# Patient Record
Sex: Female | Born: 1985 | Race: White | Hispanic: No | Smoking: Current every day smoker
Health system: Southern US, Community
[De-identification: ages and names within clinical notes are randomized; demographics above are authoritative.]

## PROBLEM LIST (undated history)

## (undated) DIAGNOSIS — N200 Calculus of kidney: Secondary | ICD-10-CM

## (undated) DIAGNOSIS — F319 Bipolar disorder, unspecified: Secondary | ICD-10-CM

## (undated) HISTORY — PX: OTHER SURGICAL HISTORY: SHX169

---

## 2013-10-22 ENCOUNTER — Emergency Department (HOSPITAL_BASED_OUTPATIENT_CLINIC_OR_DEPARTMENT_OTHER)
Admission: EM | Admit: 2013-10-22 | Discharge: 2013-10-22 | Disposition: A | Discharge status: Home / Self Care | Payer: 59 | Attending: Emergency Medicine | Admitting: Emergency Medicine

## 2013-10-22 ENCOUNTER — Encounter (HOSPITAL_BASED_OUTPATIENT_CLINIC_OR_DEPARTMENT_OTHER): Payer: Self-pay | Admitting: Emergency Medicine

## 2013-10-22 DIAGNOSIS — IMO0002 Reserved for concepts with insufficient information to code with codable children: Secondary | ICD-10-CM | POA: Insufficient documentation

## 2013-10-22 DIAGNOSIS — Z3202 Encounter for pregnancy test, result negative: Secondary | ICD-10-CM | POA: Insufficient documentation

## 2013-10-22 DIAGNOSIS — F319 Bipolar disorder, unspecified: Secondary | ICD-10-CM | POA: Insufficient documentation

## 2013-10-22 DIAGNOSIS — T148XXA Other injury of unspecified body region, initial encounter: Secondary | ICD-10-CM

## 2013-10-22 DIAGNOSIS — M549 Dorsalgia, unspecified: Secondary | ICD-10-CM

## 2013-10-22 DIAGNOSIS — Z79899 Other long term (current) drug therapy: Secondary | ICD-10-CM | POA: Insufficient documentation

## 2013-10-22 DIAGNOSIS — X58XXXA Exposure to other specified factors, initial encounter: Secondary | ICD-10-CM | POA: Insufficient documentation

## 2013-10-22 DIAGNOSIS — F17200 Nicotine dependence, unspecified, uncomplicated: Secondary | ICD-10-CM | POA: Insufficient documentation

## 2013-10-22 DIAGNOSIS — Y939 Activity, unspecified: Secondary | ICD-10-CM | POA: Insufficient documentation

## 2013-10-22 DIAGNOSIS — R1110 Vomiting, unspecified: Secondary | ICD-10-CM | POA: Insufficient documentation

## 2013-10-22 DIAGNOSIS — Y929 Unspecified place or not applicable: Secondary | ICD-10-CM | POA: Insufficient documentation

## 2013-10-22 DIAGNOSIS — Z87442 Personal history of urinary calculi: Secondary | ICD-10-CM | POA: Insufficient documentation

## 2013-10-22 HISTORY — DX: Bipolar disorder, unspecified: F31.9

## 2013-10-22 HISTORY — DX: Calculus of kidney: N20.0

## 2013-10-22 LAB — URINALYSIS, ROUTINE W REFLEX MICROSCOPIC
Bilirubin Urine: NEGATIVE
GLUCOSE, UA: NEGATIVE mg/dL
Hgb urine dipstick: NEGATIVE
KETONES UR: NEGATIVE mg/dL
Leukocytes, UA: NEGATIVE
Nitrite: NEGATIVE
PH: 7 (ref 5.0–8.0)
Protein, ur: NEGATIVE mg/dL
Specific Gravity, Urine: 1.022 (ref 1.005–1.030)
Urobilinogen, UA: 1 mg/dL (ref 0.0–1.0)

## 2013-10-22 LAB — PREGNANCY, URINE: Preg Test, Ur: NEGATIVE

## 2013-10-22 MED ORDER — ONDANSETRON 4 MG PO TBDP
4.0000 mg | ORAL_TABLET | Freq: Once | ORAL | Status: AC
  Administered 2013-10-22: 4 mg via ORAL
  Filled 2013-10-22: qty 1

## 2013-10-22 MED ORDER — PROMETHAZINE HCL 25 MG PO TABS: 25.0000 mg | ORAL_TABLET | Freq: Four times a day (QID) | ORAL | Status: DC | PRN

## 2013-10-22 MED ORDER — HYDROCODONE-ACETAMINOPHEN 5-325 MG PO TABS: 1.0000 | ORAL_TABLET | ORAL | Status: DC | PRN

## 2013-10-22 NOTE — ED Provider Notes (Signed)
TIME SEEN: 12:18 PM  CHIEF COMPLAINT: Right-sided flank pain, vomiting  HPI: Patient is a 28 y.o. female with a history of bipolar disorder, possible history of prior kidney stones who presents emergency department with 2-3 days of right-sided flank pain that she describes as a pressure that is worse with movement and better with applying pressure to the area. She states that she was seen at [facility] emergency department several years ago and had blood in her urine and was told she [place] have a kidney stone. She did not have a CT scan at that time. She denies that she has had any dysuria, hematuria, urinary frequency or urgency. No vaginal bleeding or discharge. She did have one episode of nonbloody, nonbilious vomiting this morning. No diarrhea. No fevers or chills. No history of injury to the back. No numbness, tingling or focal weakness. No bowel or bladder incontinence.  ROS: See HPI Constitutional: no fever  Eyes: no drainage  ENT: no runny nose   Cardiovascular:  no chest pain  Resp: no SOB  GI: no vomiting GU: no dysuria Integumentary: no rash  Allergy: no hives  Musculoskeletal: no leg swelling  Neurological: no slurred speech ROS otherwise negative  PAST MEDICAL HISTORY/PAST SURGICAL HISTORY:  Past Medical History  Diagnosis Date  . Kidney stone   . Bipolar 1 disorder     MEDICATIONS:  Prior to Admission medications   Medication Sig Start Date End Date Taking? Authorizing Provider  ARIPiprazole (ABILIFY PO) Take by mouth.   Yes Historical Provider, MD  LamoTRIgine (LAMICTAL PO) Take by mouth.   Yes Historical Provider, MD  Lisdexamfetamine Dimesylate (VYVANSE PO) Take by mouth.   Yes Historical Provider, MD  LITHIUM PO Take by mouth.   Yes Historical Provider, MD    ALLERGIES:  Allergies  Allergen Reactions  . Fish Allergy     SOCIAL HISTORY:  History  Substance Use Topics  . Smoking status: Current Every Day Smoker -- 1.00 packs/day    Types: Cigarettes  .  Smokeless tobacco: Not on file  . Alcohol Use: No    FAMILY HISTORY: No family history on file.  EXAM: BP 133/75  Pulse 105  Temp(Src) 98.3 F (36.8 C) (Oral)  Resp 16  Ht 5\' 3"  (1.6 m)  Wt 165 lb (74.844 kg)  BMI 29.24 kg/m2  SpO2 100%  LMP 10/08/2013 CONSTITUTIONAL: Alert and oriented and responds appropriately to questions. Well-appearing; well-nourished HEAD: Normocephalic EYES: Conjunctivae clear, PERRL ENT: normal nose; no rhinorrhea; moist mucous membranes; pharynx without lesions noted NECK: Supple, no meningismus, no LAD  CARD: RRR; S1 and S2 appreciated; no murmurs, no clicks, no rubs, no gallops RESP: Normal chest excursion without splinting or tachypnea; breath sounds clear and equal bilaterally; no wheezes, no rhonchi, no rales,  ABD/GI: Normal bowel sounds; non-distended; soft, non-tender, no rebound, no guarding BACK:  The back appears normal and is mildly tender to palpation over the right flank with no crepitus or ecchymosis or skin changes, no CVA tenderness; no midline spinal tenderness, step-off or deformity EXT: Normal ROM in all joints; non-tender to palpation; no edema; normal capillary refill; no cyanosis    SKIN: Normal color for age and race; warm NEURO: Moves all extremities equally;  cranial nerves 2 to intact, sensation to light touch intact diffusely, normal gait PSYCH: The patient's mood and manner are appropriate. Grooming and personal hygiene are appropriate.  MEDICAL DECISION MAKING: Patient here with right-sided flank pain. Her pain is reproducible with palpation of her  back. Suspect this is musculoskeletal in nature. She is hemodynamically stable and well-appearing, nontoxic and in no apparent distress. Her urine shows no sign of infection or blood. I do not feel this is a kidney stone or pyelonephritis. We'll discharge the patient home with pain medication and nausea medicine. Given strict return precautions. Patient verbalizes understanding is  comfortable plan. She does not want any anti-inflammatories because she states it caused GI upset. She also reports that she does not want any narcotics and she has to go back to work today.      Layla Maw Latana Colin, DO 10/22/13 1221

## 2013-10-22 NOTE — ED Notes (Signed)
Right flank pain that started 2 days ago, vomited x 1 this am.

## 2019-11-20 ENCOUNTER — Other Ambulatory Visit: Payer: Self-pay

## 2019-11-20 ENCOUNTER — Emergency Department
Admission: EM | Admit: 2019-11-20 | Discharge: 2019-11-20 | Disposition: A | Discharge status: Home / Self Care | Payer: BC Managed Care – PPO | Attending: Student | Admitting: Student

## 2019-11-20 ENCOUNTER — Encounter: Payer: Self-pay | Admitting: Emergency Medicine

## 2019-11-20 DIAGNOSIS — G43719 Chronic migraine without aura, intractable, without status migrainosus: Secondary | ICD-10-CM | POA: Insufficient documentation

## 2019-11-20 DIAGNOSIS — Z79899 Other long term (current) drug therapy: Secondary | ICD-10-CM | POA: Diagnosis not present

## 2019-11-20 DIAGNOSIS — F17210 Nicotine dependence, cigarettes, uncomplicated: Secondary | ICD-10-CM | POA: Diagnosis not present

## 2019-11-20 DIAGNOSIS — R519 Headache, unspecified: Secondary | ICD-10-CM | POA: Diagnosis present

## 2019-11-20 LAB — BASIC METABOLIC PANEL
Anion gap: 10 (ref 5–15)
BUN: 11 mg/dL (ref 6–20)
CO2: 28 mmol/L (ref 22–32)
Calcium: 9.8 mg/dL (ref 8.9–10.3)
Chloride: 102 mmol/L (ref 98–111)
Creatinine, Ser: 0.92 mg/dL (ref 0.44–1.00)
GFR calc Af Amer: >60 mL/min (ref >60)
GFR calc non Af Amer: >60 mL/min (ref >60)
Glucose, Bld: 106 mg/dL — ABNORMAL HIGH (ref 70–99)
Potassium: 4.3 mmol/L (ref 3.5–5.1)
Sodium: 140 mmol/L (ref 135–145)

## 2019-11-20 LAB — CBC
HCT: 47.8 % — ABNORMAL HIGH (ref 36.0–46.0)
Hemoglobin: 15.6 g/dL — ABNORMAL HIGH (ref 12.0–15.0)
MCH: 31.7 pg (ref 26.0–34.0)
MCHC: 32.6 g/dL (ref 30.0–36.0)
MCV: 97.2 fL (ref 80.0–100.0)
Platelets: 303 10*3/uL (ref 150–400)
RBC: 4.92 MIL/uL (ref 3.87–5.11)
RDW: 12.9 % (ref 11.5–15.5)
WBC: 9.6 10*3/uL (ref 4.0–10.5)
nRBC: 0 % (ref 0.0–0.2)

## 2019-11-20 MED ORDER — PROMETHAZINE HCL 25 MG/ML IJ SOLN
25.0000 mg | Freq: Once | INTRAMUSCULAR | Status: AC
  Administered 2019-11-20: 19:00:00 25 mg via INTRAMUSCULAR
  Filled 2019-11-20: qty 1

## 2019-11-20 MED ORDER — KETOROLAC TROMETHAMINE 30 MG/ML IJ SOLN
30.0000 mg | Freq: Once | INTRAMUSCULAR | Status: AC
  Administered 2019-11-20: 19:00:00 30 mg via INTRAMUSCULAR
  Filled 2019-11-20: qty 1

## 2019-11-20 MED ORDER — DIPHENHYDRAMINE HCL 50 MG/ML IJ SOLN
50.0000 mg | Freq: Once | INTRAMUSCULAR | Status: AC
  Administered 2019-11-20: 19:00:00 50 mg via INTRAMUSCULAR
  Filled 2019-11-20: qty 1

## 2019-11-20 NOTE — ED Triage Notes (Signed)
Pt presents to ED c/o migraine x4 days. Has taken imitrex with no relief. Endorses light and sound sensitivity, nausea, and dizziness. Hx of same.

## 2019-11-20 NOTE — ED Provider Notes (Signed)
[facility] Emergency Department Provider Note  ____________________________________________  Time seen: Approximately 7:04 PM  I have reviewed the triage vital signs and the nursing notes.   HISTORY  Chief Complaint Migraine    HPI Heather Walter is a 34 y.o. adult who presents the emergency department complaining of migraine x4 days.  Patient states that she is experiencing a migraine x4 days.  Patient has a neurologist, they have been trying different medications to help manage the patient's migraines.  No recent trauma.  No recent illnesses.  Patient states that the migraine has been ongoing x4 days and not relieved by normal medications.  Patient states that typically he requires "shots" to finally make the migraine improved.  Patient has an appointment in 3 days to see her neurologist.  She will discuss medication changes at that time.  She is looking for relief at this time from her current migraine.  No atypical pattern.  Patient denies any other complaints at this time.         Past Medical History:  Diagnosis Date  . Bipolar 1 disorder (HCC)   . Kidney stone     There are no problems to display for this patient.   Past Surgical History:  Procedure Laterality Date  . knee arthoscopy      Prior to Admission medications   Medication Sig Start Date End Date Taking? Authorizing Provider  ARIPiprazole (ABILIFY PO) Take by mouth.    [provider]  HYDROcodone-acetaminophen (NORCO/VICODIN) 5-325 MG per tablet Take 1 tablet by mouth every 4 (four) hours as needed. 10/22/13   Ward, Layla Maw, DO  LamoTRIgine (LAMICTAL PO) Take by mouth.    [provider]  Lisdexamfetamine Dimesylate (VYVANSE PO) Take by mouth.    [provider]  LITHIUM PO Take by mouth.    [provider]  promethazine (PHENERGAN) 25 MG tablet Take 1 tablet (25 mg total) by mouth every 6 (six) hours as needed for nausea or vomiting. 10/22/13    Ward, Layla Maw, DO    Allergies Fish allergy and Risperidone and related  History reviewed. No pertinent family history.  Social History Social History   Tobacco Use  . Smoking status: Current Every Day Smoker    Packs/day: 0.50    Types: Cigarettes  . Smokeless tobacco: Never Used  Substance Use Topics  . Alcohol use: No  . Drug use: No     Review of Systems  Constitutional: No fever/chills Eyes: No visual changes. No discharge ENT: No upper respiratory complaints. Cardiovascular: no chest pain. Respiratory: no cough. No SOB. Gastrointestinal: No abdominal pain.  No nausea, no vomiting.  No diarrhea.  No constipation. Musculoskeletal: Negative for musculoskeletal pain. Skin: Negative for rash, abrasions, lacerations, ecchymosis. Neurological: Positive for typical migraine.  Denies focal weakness or numbness. 10-point ROS otherwise negative.  ____________________________________________   PHYSICAL EXAM:  VITAL SIGNS: ED Triage Vitals [11/20/19 1839]  Enc Vitals Group     BP (!) 153/88     Pulse Rate 94     Resp 18     Temp      Temp Source Oral     SpO2 99 %     Weight 147 lb (66.7 kg)     Height 5\' 4"  (1.626 m)     Head Circumference      Peak Flow      Pain Score 10     Pain Loc      Pain Edu?  Excl. in [place]?      Constitutional: Alert and oriented. Well appearing and in no acute distress. Eyes: Conjunctivae are normal. PERRL. EOMI. Head: Atraumatic. ENT:      Ears:       Nose: No congestion/rhinnorhea.      Mouth/Throat: Mucous membranes are moist.  Neck: No stridor.  Neck is supple full range of motion  Cardiovascular: Normal rate, regular rhythm. Normal S1 and S2.  Good peripheral circulation. Respiratory: Normal respiratory effort without tachypnea or retractions. Lungs CTAB. Good air entry to the bases with no decreased or absent breath sounds. Musculoskeletal: Full range of motion to all extremities. No gross deformities  appreciated. Neurologic:  Normal speech and language. No gross focal neurologic deficits are appreciated.  Cranial nerves II through XII grossly intact. Skin:  Skin is warm, dry and intact. No rash noted. Psychiatric: Mood and affect are normal. Speech and behavior are normal. Patient exhibits appropriate insight and judgement.   ____________________________________________   LABS (all labs ordered are listed, but only abnormal results are displayed)  Labs Reviewed  BASIC METABOLIC PANEL - Abnormal; Notable for the following components:      Result Value   Glucose, Bld 106 (*)    All other components within normal limits  CBC - Abnormal; Notable for the following components:   Hemoglobin 15.6 (*)    HCT 47.8 (*)    All other components within normal limits   ____________________________________________  EKG   ____________________________________________  RADIOLOGY   No results found.  ____________________________________________    PROCEDURES  Procedure(s) performed:    Procedures    Medications  ketorolac (TORADOL) 30 MG/ML injection 30 mg (30 mg Intramuscular Given 11/20/19 1920)  promethazine (PHENERGAN) injection 25 mg (25 mg Intramuscular Given 11/20/19 1920)  diphenhydrAMINE (BENADRYL) injection 50 mg (50 mg Intramuscular Given 11/20/19 1920)     ____________________________________________   INITIAL IMPRESSION / ASSESSMENT AND PLAN / ED COURSE  Pertinent labs & imaging results that were available during my care of the patient were reviewed by me and considered in my medical decision making (see chart for details).  Review of the [place] CSRS was performed in accordance of the [place] prior to dispensing any controlled drugs.           Patient's diagnosis is consistent with migraine headache.  Patient presented to emergency department with typical migraine.  Patient has been working with her neurologist for medication management to successfully treat  migraines.  This current migraine has been in place x4 days and is not responding to normal medications.  Patient is requesting the "shots" to help with the migraine.  Patient will be given migraine cocktail here in the emergency department.  She already has an appointment in 3 days to follow-up with her neurologist to discuss further migraine medications.  No medication changes will be attempted at this time..  Patient is given ED precautions to return to the ED for any worsening or new symptoms.     ____________________________________________  FINAL CLINICAL IMPRESSION(S) / ED DIAGNOSES  Final diagnoses:  Intractable chronic migraine without aura and without status migrainosus      NEW MEDICATIONS STARTED DURING THIS VISIT:  ED Discharge Orders    None          This chart was dictated using voice recognition software/Dragon. Despite best efforts to proofread, errors can occur which can change the meaning. Any change was purely unintentional.    Darletta Moll, PA-C 11/20/19 1951  Miguel Aschoff., MD 11/21/19 1225

## 2019-11-20 NOTE — ED Notes (Signed)
Pt states he has had a migraine x 4 days. Pt states he has hx of migraines and take nortriptyline daily and augments w/ imitrex for more severe migraines. Pt c/o blurred vision, nausea, shakiness, and dizziness intermittently w/ this current episode.

## 2019-12-16 ENCOUNTER — Other Ambulatory Visit: Payer: Self-pay

## 2019-12-16 ENCOUNTER — Encounter: Payer: Self-pay | Admitting: Medical Oncology

## 2019-12-16 ENCOUNTER — Emergency Department
Admission: EM | Admit: 2019-12-16 | Discharge: 2019-12-16 | Disposition: A | Discharge status: Home / Self Care | Payer: BC Managed Care – PPO | Attending: Emergency Medicine | Admitting: Emergency Medicine

## 2019-12-16 DIAGNOSIS — G43901 Migraine, unspecified, not intractable, with status migrainosus: Secondary | ICD-10-CM | POA: Diagnosis not present

## 2019-12-16 DIAGNOSIS — Z79899 Other long term (current) drug therapy: Secondary | ICD-10-CM | POA: Insufficient documentation

## 2019-12-16 DIAGNOSIS — R519 Headache, unspecified: Secondary | ICD-10-CM | POA: Diagnosis present

## 2019-12-16 DIAGNOSIS — F17210 Nicotine dependence, cigarettes, uncomplicated: Secondary | ICD-10-CM | POA: Insufficient documentation

## 2019-12-16 MED ORDER — KETOROLAC TROMETHAMINE 10 MG PO TABS: 10.0000 mg | ORAL_TABLET | Freq: Four times a day (QID) | ORAL | 0 refills | Status: DC | PRN

## 2019-12-16 MED ORDER — METOCLOPRAMIDE HCL 10 MG PO TABS: 10.0000 mg | ORAL_TABLET | Freq: Four times a day (QID) | ORAL | 0 refills | Status: DC | PRN

## 2019-12-16 MED ORDER — DIPHENHYDRAMINE HCL 25 MG PO CAPS: 50.0000 mg | ORAL_CAPSULE | Freq: Four times a day (QID) | ORAL | 0 refills | Status: AC | PRN

## 2019-12-16 MED ORDER — ONDANSETRON 4 MG PO TBDP: 4.0000 mg | ORAL_TABLET | Freq: Three times a day (TID) | ORAL | 0 refills | Status: DC | PRN

## 2019-12-16 MED ORDER — KETOROLAC TROMETHAMINE 30 MG/ML IJ SOLN
15.0000 mg | INTRAMUSCULAR | Status: AC
  Administered 2019-12-16: 18:00:00 15 mg via INTRAVENOUS
  Filled 2019-12-16: qty 1

## 2019-12-16 MED ORDER — METOCLOPRAMIDE HCL 5 MG/ML IJ SOLN
10.0000 mg | Freq: Once | INTRAMUSCULAR | Status: AC
  Administered 2019-12-16: 18:00:00 10 mg via INTRAVENOUS
  Filled 2019-12-16: qty 2

## 2019-12-16 MED ORDER — SODIUM CHLORIDE 0.9 % IV BOLUS
1000.0000 mL | Freq: Once | INTRAVENOUS | Status: AC
  Administered 2019-12-16: 18:00:00 1000 mL via INTRAVENOUS

## 2019-12-16 MED ORDER — DIPHENHYDRAMINE HCL 50 MG/ML IJ SOLN
25.0000 mg | Freq: Once | INTRAMUSCULAR | Status: AC
  Administered 2019-12-16: 18:00:00 25 mg via INTRAVENOUS
  Filled 2019-12-16: qty 1

## 2019-12-16 NOTE — ED Triage Notes (Signed)
Pt reports migraine headache x 4 days. Has had imitrex without relief. Hx of same.

## 2019-12-16 NOTE — ED Provider Notes (Signed)
[facility] Emergency Department Provider Note  ____________________________________________  Time seen: Approximately 9:18 PM  I have reviewed the triage vital signs and the nursing notes.   HISTORY  Chief Complaint Migraine    HPI Heather Walter is a 34 y.o. adult with a history of kidney stones bipolar disorder and recurrent migraine headaches who comes the ED complaining of a migraine headache for the past 4 days with photophobia, malaise.  Feels exactly like prior headaches, constant, waxing and waning, no aggravating or alleviating factors.  Tried his home medications including Imitrex without relief.      Past Medical History:  Diagnosis Date  . Bipolar 1 disorder (HCC)   . Kidney stone      There are no problems to display for this patient.    Past Surgical History:  Procedure Laterality Date  . knee arthoscopy       Prior to Admission medications   Medication Sig Start Date End Date Taking? Authorizing Provider  ARIPiprazole (ABILIFY PO) Take by mouth.    [provider]  diphenhydrAMINE (BENADRYL) 25 mg capsule Take 2 capsules (50 mg total) by mouth every 6 (six) hours as needed. 12/16/19   Sharman Cheek, MD  HYDROcodone-acetaminophen (NORCO/VICODIN) 5-325 MG per tablet Take 1 tablet by mouth every 4 (four) hours as needed. 10/22/13   Ward, Layla Maw, DO  ketorolac (TORADOL) 10 MG tablet Take 1 tablet (10 mg total) by mouth every 6 (six) hours as needed for moderate pain. 12/16/19   Sharman Cheek, MD  LamoTRIgine (LAMICTAL PO) Take by mouth.    [provider]  Lisdexamfetamine Dimesylate (VYVANSE PO) Take by mouth.    [provider]  LITHIUM PO Take by mouth.    [provider]  metoCLOPramide (REGLAN) 10 MG tablet Take 1 tablet (10 mg total) by mouth every 6 (six) hours as needed. 12/16/19   Sharman Cheek, MD  ondansetron (ZOFRAN ODT) 4 MG disintegrating tablet Take 1 tablet (4 mg total)  by mouth every 8 (eight) hours as needed for nausea or vomiting. 12/16/19   Sharman Cheek, MD  promethazine (PHENERGAN) 25 MG tablet Take 1 tablet (25 mg total) by mouth every 6 (six) hours as needed for nausea or vomiting. 10/22/13   Ward, Layla Maw, DO     Allergies Fish allergy and Risperidone and related   No family history on file.  Social History Social History   Tobacco Use  . Smoking status: Current Every Day Smoker    Packs/day: 0.50    Types: Cigarettes  . Smokeless tobacco: Never Used  Substance Use Topics  . Alcohol use: No  . Drug use: No    Review of Systems  Constitutional:   No fever or chills.  ENT:   No sore throat. No rhinorrhea. Cardiovascular:   No chest pain or syncope. Respiratory:   No dyspnea or cough. Gastrointestinal:   Negative for abdominal pain, vomiting and diarrhea.  Musculoskeletal:   Negative for focal pain or swelling All other systems reviewed and are negative except as documented above in ROS and HPI.  ____________________________________________   PHYSICAL EXAM:  VITAL SIGNS: ED Triage Vitals  Enc Vitals Group     BP 12/16/19 1619 (!) 158/89     Pulse Rate 12/16/19 1619 (!) 120     Resp 12/16/19 1619 18     Temp 12/16/19 1619 98.4 F (36.9 C)     Temp Source 12/16/19 1619 Oral     SpO2 12/16/19  1619 99 %     Weight 12/16/19 1615 145 lb 8.1 oz (66 kg)     Height 12/16/19 1615 5\' 4"  (1.626 m)     Head Circumference --      Peak Flow --      Pain Score 12/16/19 1615 6     Pain Loc --      Pain Edu? --      Excl. in [place]? --     Vital signs reviewed, nursing assessments reviewed.   Constitutional:   Alert and oriented. Non-toxic appearance. Eyes:   Conjunctivae are normal. EOMI. positive photophobia. ENT      Head:   Normocephalic and atraumatic.      Nose:   Wearing a mask.      Mouth/Throat:   Wearing a mask.      Neck:   No meningismus. Full ROM. Hematological/Lymphatic/Immunilogical:   No cervical  lymphadenopathy. Cardiovascular:   RRR. Symmetric bilateral radial and DP pulses.  No murmurs. Cap refill less than 2 seconds. Respiratory:   Normal respiratory effort without tachypnea/retractions. Breath sounds are clear and equal bilaterally. No wheezes/rales/rhonchi. Gastrointestinal:   Soft and nontender. Non distended. There is no CVA tenderness.  No rebound, rigidity, or guarding. Genitourinary:   deferred Musculoskeletal:   Normal range of motion in all extremities. No joint effusions.  No lower extremity tenderness.  No edema. Neurologic:   Normal speech and language.  Motor grossly intact. Cerebellar function intact.  Ambulatory with steady gait No acute focal neurologic deficits are appreciated.  Skin:    Skin is warm, dry and intact. No rash noted.  No petechiae, purpura, or bullae.  ____________________________________________    LABS (pertinent positives/negatives) (all labs ordered are listed, but only abnormal results are displayed) Labs Reviewed - No data to display ____________________________________________   EKG    ____________________________________________    RADIOLOGY  No results found.  ____________________________________________   PROCEDURES Procedures  ____________________________________________    CLINICAL IMPRESSION / ASSESSMENT AND PLAN / ED COURSE  Medications ordered in the ED: Medications  sodium chloride 0.9 % bolus 1,000 mL (0 mLs Intravenous Stopped 12/16/19 1954)  metoCLOPramide (REGLAN) injection 10 mg (10 mg Intravenous Given 12/16/19 1752)  ketorolac (TORADOL) 30 MG/ML injection 15 mg (15 mg Intravenous Given 12/16/19 1751)  diphenhydrAMINE (BENADRYL) injection 25 mg (25 mg Intravenous Given 12/16/19 1756)    Pertinent labs & imaging results that were available during my care of the patient were reviewed by me and considered in my medical decision making (see chart for details).  Heather Walter was evaluated in Emergency  Department on 12/16/2019 for the symptoms described in the history of present illness. He was evaluated in the context of the global COVID-19 pandemic, which necessitated consideration that the patient might be at risk for infection with the SARS-CoV-2 virus that causes COVID-19. Institutional protocols and algorithms that pertain to the evaluation of patients at risk for COVID-19 are in a state of rapid change based on information released by regulatory bodies including the CDC and federal and state organizations. These policies and algorithms were followed during the patient's care in the ED.   Patient presents with recurrent migraine headache, status migrainosus.  Also some evidence of dehydration as a result as evidenced by the triage tachycardia.  No evidence of meningitis encephalitis or intracranial hypertension.  No neuroimaging needed.  Patient given IV fluids for hydration, Reglan Benadryl and Toradol with relief of his headache.  He is eager to go home.  I will write him prescriptions for same medications to continue home therapy as needed.  Follow-up with primary care.      ____________________________________________   FINAL CLINICAL IMPRESSION(S) / ED DIAGNOSES    Final diagnoses:  Status migrainosus     ED Discharge Orders         Ordered    metoCLOPramide (REGLAN) 10 MG tablet  Every 6 hours PRN     12/16/19 2118    diphenhydrAMINE (BENADRYL) 25 mg capsule  Every 6 hours PRN     12/16/19 2118    ondansetron (ZOFRAN ODT) 4 MG disintegrating tablet  Every 8 hours PRN     12/16/19 2118    ketorolac (TORADOL) 10 MG tablet  Every 6 hours PRN     12/16/19 2118          Portions of this note were generated with dragon dictation software. Dictation errors [place] occur despite best attempts at proofreading.   Sharman Cheek, MD 12/16/19 2121

## 2020-02-08 ENCOUNTER — Emergency Department: Payer: BC Managed Care – PPO

## 2020-02-08 ENCOUNTER — Other Ambulatory Visit: Payer: Self-pay

## 2020-02-08 ENCOUNTER — Encounter: Payer: Self-pay | Admitting: *Deleted

## 2020-02-08 ENCOUNTER — Emergency Department
Admission: EM | Admit: 2020-02-08 | Discharge: 2020-02-08 | Disposition: A | Discharge status: Home / Self Care | Payer: BC Managed Care – PPO | Attending: Emergency Medicine | Admitting: Emergency Medicine

## 2020-02-08 DIAGNOSIS — Y990 Civilian activity done for income or pay: Secondary | ICD-10-CM | POA: Diagnosis not present

## 2020-02-08 DIAGNOSIS — S0003XA Contusion of scalp, initial encounter: Secondary | ICD-10-CM

## 2020-02-08 DIAGNOSIS — Z23 Encounter for immunization: Secondary | ICD-10-CM | POA: Insufficient documentation

## 2020-02-08 DIAGNOSIS — Y9289 Other specified places as the place of occurrence of the external cause: Secondary | ICD-10-CM | POA: Insufficient documentation

## 2020-02-08 DIAGNOSIS — W228XXA Striking against or struck by other objects, initial encounter: Secondary | ICD-10-CM | POA: Diagnosis not present

## 2020-02-08 DIAGNOSIS — Z79899 Other long term (current) drug therapy: Secondary | ICD-10-CM | POA: Diagnosis not present

## 2020-02-08 DIAGNOSIS — S0001XA Abrasion of scalp, initial encounter: Secondary | ICD-10-CM

## 2020-02-08 DIAGNOSIS — S0101XA Laceration without foreign body of scalp, initial encounter: Secondary | ICD-10-CM | POA: Diagnosis not present

## 2020-02-08 DIAGNOSIS — F17210 Nicotine dependence, cigarettes, uncomplicated: Secondary | ICD-10-CM | POA: Diagnosis not present

## 2020-02-08 DIAGNOSIS — S0990XA Unspecified injury of head, initial encounter: Secondary | ICD-10-CM | POA: Diagnosis present

## 2020-02-08 DIAGNOSIS — Y9389 Activity, other specified: Secondary | ICD-10-CM | POA: Diagnosis not present

## 2020-02-08 MED ORDER — TETANUS-DIPHTH-ACELL PERTUSSIS 5-2.5-18.5 LF-MCG/0.5 IM SUSP
0.5000 mL | Freq: Once | INTRAMUSCULAR | Status: AC
Start: 2020-02-08 — End: 2020-02-08
  Administered 2020-02-08: 17:00:00 0.5 mL via INTRAMUSCULAR
  Filled 2020-02-08: qty 0.5

## 2020-02-08 MED ORDER — ACETAMINOPHEN 500 MG PO TABS
1000.0000 mg | ORAL_TABLET | Freq: Once | ORAL | Status: AC
  Administered 2020-02-08: 16:00:00 1000 mg via ORAL
  Filled 2020-02-08: qty 2

## 2020-02-08 MED ORDER — CEPHALEXIN 500 MG PO CAPS: 500.0000 mg | ORAL_CAPSULE | Freq: Three times a day (TID) | ORAL | 0 refills | Status: AC

## 2020-02-08 NOTE — ED Provider Notes (Signed)
Emergency Department Provider Note  ____________________________________________  Time seen: Approximately 4:08 PM  I have reviewed the triage vital signs and the nursing notes.   HISTORY  Chief Complaint Laceration   Historian Patient     HPI Heather Walter is a 34 y.o. adult presents to the emergency department with a superficial laceration along the top of the head.  Patient reports that he struck his head on a piece of metal.  He is unsure whether or not he lost consciousness.  He is having some pain in his neck.  No numbness or tingling in the upper and lower extremities.  No chest pain, chest tightness or abdominal pain.  Tetanus status is not up-to-date.   Past Medical History:  Diagnosis Date  . Bipolar 1 disorder (HCC)   . Kidney stone      Immunizations up to date:  Yes.     Past Medical History:  Diagnosis Date  . Bipolar 1 disorder (HCC)   . Kidney stone     There are no problems to display for this patient.   Past Surgical History:  Procedure Laterality Date  . knee arthoscopy      Prior to Admission medications   Medication Sig Start Date End Date Taking? Authorizing Provider  ARIPiprazole (ABILIFY PO) Take by mouth.    [provider]  cephALEXin (KEFLEX) 500 MG capsule Take 1 capsule (500 mg total) by mouth 3 (three) times daily for 7 days. 02/08/20 02/15/20  Orvil Feil, PA-C  diphenhydrAMINE (BENADRYL) 25 mg capsule Take 2 capsules (50 mg total) by mouth every 6 (six) hours as needed. 12/16/19   Sharman Cheek, MD  LamoTRIgine (LAMICTAL PO) Take by mouth.    [provider]  Lisdexamfetamine Dimesylate (VYVANSE PO) Take by mouth.    [provider]  LITHIUM PO Take by mouth.    [provider]    Allergies Fish allergy and Risperidone and related  No family history on file.  Social History Social History   Tobacco Use  . Smoking status: Current Every Day Smoker    Packs/day: 0.50   Types: Cigarettes  . Smokeless tobacco: Never Used  Substance Use Topics  . Alcohol use: No  . Drug use: No     Review of Systems  Constitutional: No fever/chills Eyes:  No discharge ENT: No upper respiratory complaints. Respiratory: no cough. No SOB/ use of accessory muscles to breath Gastrointestinal:   No nausea, no vomiting.  No diarrhea.  No constipation. Musculoskeletal: Negative for musculoskeletal pain. Skin: Patient has scalp laceration.  ____________________________________________   PHYSICAL EXAM:  VITAL SIGNS: ED Triage Vitals  Enc Vitals Group     BP 02/08/20 1535 132/77     Pulse Rate 02/08/20 1535 94     Resp 02/08/20 1535 20     Temp 02/08/20 1535 98.3 F (36.8 C)     Temp Source 02/08/20 1535 Oral     SpO2 02/08/20 1535 100 %     Weight 02/08/20 1538 140 lb (63.5 kg)     Height 02/08/20 1538 5\' 4"  (1.626 m)     Head Circumference --      Peak Flow --      Pain Score 02/08/20 1537 10     Pain Loc --      Pain Edu? --      Excl. in [place]? --      Constitutional: Alert and oriented. Well appearing and in no acute distress. Eyes: Conjunctivae are  normal. PERRL. EOMI. Head: Atraumatic. Patient has a 1 cm superficial scalp laceration. No hematomas palpated.  ENT:      Ears: TMs are pearly.       Nose: No congestion/rhinnorhea.      Mouth/Throat: Mucous membranes are moist.  Neck: No stridor.  No cervical spine tenderness to palpation. Cardiovascular: Normal rate, regular rhythm. Normal S1 and S2.  Good peripheral circulation. Respiratory: Normal respiratory effort without tachypnea or retractions. Lungs CTAB. Good air entry to the bases with no decreased or absent breath sounds Gastrointestinal: Bowel sounds x 4 quadrants. Soft and nontender to palpation. No guarding or rigidity. No distention. Musculoskeletal: Full range of motion to all extremities. No obvious deformities noted Neurologic:  Normal for age. No gross focal neurologic deficits are  appreciated.  Skin: No rash noted. Psychiatric: Mood and affect are normal for age. Speech and behavior are normal.   ____________________________________________   LABS (all labs ordered are listed, but only abnormal results are displayed)  Labs Reviewed - No data to display ____________________________________________  EKG   ____________________________________________  RADIOLOGY   CT Head Wo Contrast  Result Date: 02/08/2020 CLINICAL DATA:  Pain. Abrasion on top left side of head. EXAM: CT HEAD WITHOUT CONTRAST CT CERVICAL SPINE WITHOUT CONTRAST TECHNIQUE: Multidetector CT imaging of the head and cervical spine was performed following the standard protocol without intravenous contrast. Multiplanar CT image reconstructions of the cervical spine were also generated. COMPARISON:  None. FINDINGS: CT HEAD FINDINGS Brain: No evidence of acute infarction, hemorrhage, hydrocephalus, extra-axial collection or mass lesion/mass effect. Vascular: No hyperdense vessel or unexpected calcification. Skull: Normal. Negative for fracture or focal lesion. Sinuses/Orbits: No acute finding. Other: None. CT CERVICAL SPINE FINDINGS Alignment: Normal. Skull base and vertebrae: No acute fracture. No primary bone lesion or focal pathologic process. Soft tissues and spinal canal: No prevertebral fluid or swelling. No visible canal hematoma. Disc levels: There are minimal degenerative changes of the cervical spine, greatest at the C5-C6 level. Upper chest: Negative. Other: None IMPRESSION: 1. Normal CT evaluation of the head. 2. No evidence of acute traumatic injury to the cervical spine. 3. Minimal degenerative changes of the cervical spine, greatest at the C5-C6 level. Electronically Signed   By: Katherine Mantle M.D.   On: 02/08/2020 16:32   CT Cervical Spine Wo Contrast  Result Date: 02/08/2020 CLINICAL DATA:  Pain. Abrasion on top left side of head. EXAM: CT HEAD WITHOUT CONTRAST CT CERVICAL SPINE WITHOUT  CONTRAST TECHNIQUE: Multidetector CT imaging of the head and cervical spine was performed following the standard protocol without intravenous contrast. Multiplanar CT image reconstructions of the cervical spine were also generated. COMPARISON:  None. FINDINGS: CT HEAD FINDINGS Brain: No evidence of acute infarction, hemorrhage, hydrocephalus, extra-axial collection or mass lesion/mass effect. Vascular: No hyperdense vessel or unexpected calcification. Skull: Normal. Negative for fracture or focal lesion. Sinuses/Orbits: No acute finding. Other: None. CT CERVICAL SPINE FINDINGS Alignment: Normal. Skull base and vertebrae: No acute fracture. No primary bone lesion or focal pathologic process. Soft tissues and spinal canal: No prevertebral fluid or swelling. No visible canal hematoma. Disc levels: There are minimal degenerative changes of the cervical spine, greatest at the C5-C6 level. Upper chest: Negative. Other: None IMPRESSION: 1. Normal CT evaluation of the head. 2. No evidence of acute traumatic injury to the cervical spine. 3. Minimal degenerative changes of the cervical spine, greatest at the C5-C6 level. Electronically Signed   By: Katherine Mantle M.D.   On: 02/08/2020 16:32  ____________________________________________    PROCEDURES  Procedure(s) performed:     Procedures     Medications  acetaminophen (TYLENOL) tablet 1,000 mg (1,000 mg Oral Given 02/08/20 1614)  Tdap (BOOSTRIX) injection 0.5 mL (0.5 mLs Intramuscular Given 02/08/20 1703)     ____________________________________________   INITIAL IMPRESSION / ASSESSMENT AND PLAN / ED COURSE  Pertinent labs & imaging results that were available during my care of the patient were reviewed by me and considered in my medical decision making (see chart for details).      Assessment and Plan:  Laceration:  Head contusion 34 year old female presents to the emergency department with a 1 cm scalp laceration sustained after patient  struck his head on a piece of metal.  CT head reveals no evidence of intracranial bleed or skull fracture.  CT of the cervical spine revealed no bony abnormality.  Patient declined laceration repair in the emergency department.  Basic wound care was given.  Tetanus status was updated and patient was started on Keflex.  Return precautions were given to return with new or worsening symptoms.   ____________________________________________  FINAL CLINICAL IMPRESSION(S) / ED DIAGNOSES  Final diagnoses:  Abrasion of scalp, initial encounter  Contusion of scalp, initial encounter      NEW MEDICATIONS STARTED DURING THIS VISIT:  ED Discharge Orders         Ordered    cephALEXin (KEFLEX) 500 MG capsule  3 times daily     02/08/20 1706              This chart was dictated using voice recognition software/Dragon. Despite best efforts to proofread, errors can occur which can change the meaning. Any change was purely unintentional.     Lannie Fields, PA-C 02/08/20 Adan Sis, MD 02/08/20 [phone]

## 2020-02-08 NOTE — ED Triage Notes (Signed)
Pt has a small lac to top of head.  Pt cut head on metal at work.  States no WC.  Bleeding controlled.  Pt alert.

## 2020-03-09 ENCOUNTER — Emergency Department: Payer: BC Managed Care – PPO

## 2020-03-09 ENCOUNTER — Encounter: Payer: Self-pay | Admitting: Emergency Medicine

## 2020-03-09 ENCOUNTER — Other Ambulatory Visit: Payer: Self-pay

## 2020-03-09 ENCOUNTER — Emergency Department
Admission: EM | Admit: 2020-03-09 | Discharge: 2020-03-09 | Disposition: A | Discharge status: Home / Self Care | Payer: BC Managed Care – PPO | Attending: Student in an Organized Health Care Education/Training Program | Admitting: Student in an Organized Health Care Education/Training Program

## 2020-03-09 DIAGNOSIS — S6991XA Unspecified injury of right wrist, hand and finger(s), initial encounter: Secondary | ICD-10-CM | POA: Diagnosis present

## 2020-03-09 DIAGNOSIS — Y9389 Activity, other specified: Secondary | ICD-10-CM | POA: Diagnosis not present

## 2020-03-09 DIAGNOSIS — Y999 Unspecified external cause status: Secondary | ICD-10-CM | POA: Diagnosis not present

## 2020-03-09 DIAGNOSIS — W2209XA Striking against other stationary object, initial encounter: Secondary | ICD-10-CM | POA: Insufficient documentation

## 2020-03-09 DIAGNOSIS — S60511A Abrasion of right hand, initial encounter: Secondary | ICD-10-CM

## 2020-03-09 DIAGNOSIS — F319 Bipolar disorder, unspecified: Secondary | ICD-10-CM | POA: Insufficient documentation

## 2020-03-09 DIAGNOSIS — Y929 Unspecified place or not applicable: Secondary | ICD-10-CM | POA: Insufficient documentation

## 2020-03-09 DIAGNOSIS — S60221A Contusion of right hand, initial encounter: Secondary | ICD-10-CM | POA: Diagnosis not present

## 2020-03-09 MED ORDER — NAPROXEN 500 MG PO TABS
500.0000 mg | ORAL_TABLET | Freq: Once | ORAL | Status: AC
  Administered 2020-03-09: 500 mg via ORAL
  Filled 2020-03-09: qty 1

## 2020-03-09 MED ORDER — NAPROXEN 500 MG PO TABS: 500.0000 mg | ORAL_TABLET | Freq: Two times a day (BID) | ORAL | Status: DC

## 2020-03-09 MED ORDER — NEOSPORIN PLUS PAIN RELIEF MS 3.5-10000-10 EX CREA: TOPICAL_CREAM | Freq: Two times a day (BID) | CUTANEOUS | 0 refills | Status: AC

## 2020-03-09 MED ORDER — BACITRACIN-NEOMYCIN-POLYMYXIN 400-5-5000 EX OINT
TOPICAL_OINTMENT | Freq: Once | CUTANEOUS | Status: AC
Start: 2020-03-09 — End: 2020-03-09
  Administered 2020-03-09: 1 via TOPICAL
  Filled 2020-03-09: qty 1

## 2020-03-09 NOTE — ED Notes (Signed)
See triage note  Presents with injury to right hand  States punched the refrigerator   Good pulses
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
## 2020-03-09 NOTE — Discharge Instructions (Addendum)
Follow discharge care instruction take medication as directed. °

## 2020-03-09 NOTE — ED Provider Notes (Signed)
[facility] Emergency Department Provider Note   ____________________________________________   First MD Initiated Contact with Patient 03/09/20 1526     (approximate)  I have reviewed the triage vital signs and the nursing notes.   HISTORY  Chief Complaint Hand Pain    HPI Heather Walter is a 34 y.o. adult patient complain of right hand pain secondary to punching refrigerator left last night.  Patient also sustained abrasions to the right hand.  Patient rates pain as a 7/10.  Patient described pain is "achy".  Denies loss of sensation.  No palliative measures for complaint.         Past Medical History:  Diagnosis Date  . Bipolar 1 disorder ([place])   . Kidney stone     There are no problems to display for this patient.   Past Surgical History:  Procedure Laterality Date  . knee arthoscopy      Prior to Admission medications   Medication Sig Start Date End Date Taking? Authorizing Provider  ARIPiprazole (ABILIFY PO) Take by mouth.    [provider]  diphenhydrAMINE (BENADRYL) 25 mg capsule Take 2 capsules (50 mg total) by mouth every 6 (six) hours as needed. 12/16/19   Carrie Mew, MD  LamoTRIgine (LAMICTAL PO) Take by mouth.    [provider]  Lisdexamfetamine Dimesylate (VYVANSE PO) Take by mouth.    [provider]  LITHIUM PO Take by mouth.    [provider]  naproxen (NAPROSYN) 500 MG tablet Take 1 tablet (500 mg total) by mouth 2 (two) times daily with a meal. 03/09/20   Sable Feil, PA-C  neomycin-polymyxin-pramoxine (NEOSPORIN PLUS) 1 % cream Apply topically 2 (two) times daily. 03/09/20   Sable Feil, PA-C    Allergies Fish allergy and Risperidone and related  No family history on file.  Social History Social History   Tobacco Use  . Smoking status: Current Every Day Smoker    Packs/day: 0.50    Types: Cigarettes  . Smokeless tobacco: Never Used  Substance Use Topics  .  Alcohol use: No  . Drug use: No    Review of Systems Constitutional: No fever/chills.  Transgender female Eyes: No visual changes. ENT: No sore throat. Cardiovascular: Denies chest pain. Respiratory: Denies shortness of breath. Gastrointestinal: No abdominal pain.  No nausea, no vomiting.  No diarrhea.  No constipation. Genitourinary: Negative for dysuria. Musculoskeletal: Right hand pain. Skin: Negative for rash.  Abrasions to the dorsal aspect of the right hand. Neurological: Negative for headaches, focal weakness or numbness. Psychiatric:  Bipolar. Allergic/Immunilogical: Patient allergies and Risperidone ____________________________________________   PHYSICAL EXAM:  VITAL SIGNS: ED Triage Vitals  Enc Vitals Group     BP 03/09/20 1413 123/74     Pulse Rate 03/09/20 1413 92     Resp 03/09/20 1413 20     Temp 03/09/20 1413 98.4 F (36.9 C)     Temp Source 03/09/20 1413 Oral     SpO2 03/09/20 1413 99 %     Weight 03/09/20 1411 140 lb (63.5 kg)     Height 03/09/20 1411 5\' 4"  (1.626 m)     Head Circumference --      Peak Flow --      Pain Score 03/09/20 1411 7     Pain Loc --      Pain Edu? --      Excl. in [place]? --     Constitutional: Alert and oriented. Well appearing and in  no acute distress. Cardiovascular: Normal rate, regular rhythm. Grossly normal heart sounds.  Good peripheral circulation. Respiratory: Normal respiratory effort.  No retractions. Lungs CTAB. Musculoskeletal: No obvious deformity to the right hand.  Presents moderate guarding to the ulnar aspect of the right hand.  Neurologic:  Normal speech and language. No gross focal neurologic deficits are appreciated. No gait instability. Skin:  Skin is warm, dry and intact. No rash noted.  Abrasion dorsal aspect of right hand Psychiatric: Mood and affect are normal. Speech and behavior are normal.  ____________________________________________   LABS (all labs ordered are listed, but only abnormal results are  displayed)  Labs Reviewed - No data to display ____________________________________________  EKG   ____________________________________________  RADIOLOGY  ED MD interpretation:    Official radiology report(s): DG Hand Complete Right  Result Date: 03/09/2020 CLINICAL DATA:  Pain after punching solid object EXAM: RIGHT HAND - COMPLETE 3+ VIEW COMPARISON:  None. FINDINGS: Frontal, oblique, and lateral views were obtained. There is soft tissue swelling dorsally. No fracture or dislocation. Joint spaces appear normal. No erosive change. IMPRESSION: Soft tissue swelling dorsally. No fracture or dislocation. No evident arthropathy. Electronically Signed   By: Bretta Bang III M.D.   On: 03/09/2020 16:23    ____________________________________________   PROCEDURES  Procedure(s) performed (including Critical Care):  Procedures   ____________________________________________   INITIAL IMPRESSION / ASSESSMENT AND PLAN / ED COURSE  As part of my medical decision making, I reviewed the following data within the electronic MEDICAL RECORD NUMBER     Patient presents with right hand pain secondary to striking refrigerator last night.  Discussed negative x-ray findings with patient.  Patient also sustained abrasions to the dorsal aspect of the right hand.  Patient given discharge care instructions.  Patient given a prescription for naproxen and Neosporin.  Abrasion with clean and bandaged prior to departure.  Patient hand was Ace wrapped.    Heather Walter was evaluated in Emergency Department on 03/09/2020 for the symptoms described in the history of present illness. He was evaluated in the context of the global COVID-19 pandemic, which necessitated consideration that the patient might be at risk for infection with the SARS-CoV-2 virus that causes COVID-19. Institutional protocols and algorithms that pertain to the evaluation of patients at risk for COVID-19 are in a state of rapid change  based on information released by regulatory bodies including the CDC and federal and state organizations. These policies and algorithms were followed during the patient's care in the ED.       ____________________________________________   FINAL CLINICAL IMPRESSION(S) / ED DIAGNOSES  Final diagnoses:  Contusion of right hand, initial encounter  Abrasion of right hand, initial encounter     ED Discharge Orders         Ordered    naproxen (NAPROSYN) 500 MG tablet  2 times daily with meals     03/09/20 1633    neomycin-polymyxin-pramoxine (NEOSPORIN PLUS) 1 % cream  2 times daily     03/09/20 1634           Note:  This document was prepared using Dragon voice recognition software and Kishimoto include unintentional dictation errors.    Joni Reining, PA-C 03/09/20 1638    Willy Eddy, MD 03/09/20 [phone]

## 2020-03-31 ENCOUNTER — Emergency Department: Payer: BC Managed Care – PPO

## 2020-03-31 ENCOUNTER — Encounter: Payer: Self-pay | Admitting: *Deleted

## 2020-03-31 ENCOUNTER — Other Ambulatory Visit: Payer: Self-pay

## 2020-03-31 DIAGNOSIS — Y939 Activity, unspecified: Secondary | ICD-10-CM | POA: Diagnosis not present

## 2020-03-31 DIAGNOSIS — S65901A Unspecified injury of unspecified blood vessel at wrist and hand level of right arm, initial encounter: Secondary | ICD-10-CM | POA: Diagnosis not present

## 2020-03-31 DIAGNOSIS — W228XXA Striking against or struck by other objects, initial encounter: Secondary | ICD-10-CM | POA: Diagnosis not present

## 2020-03-31 DIAGNOSIS — Y929 Unspecified place or not applicable: Secondary | ICD-10-CM | POA: Insufficient documentation

## 2020-03-31 DIAGNOSIS — Y999 Unspecified external cause status: Secondary | ICD-10-CM | POA: Diagnosis not present

## 2020-03-31 NOTE — ED Triage Notes (Signed)
Pt has right hand pain after hitting the refrigerator with his fist 5 days ago.  Pt did similar thing 2 weeks ago and was seen in the er   Pt has swelling to right hand.  Pt alert.

## 2020-04-01 ENCOUNTER — Emergency Department
Admission: EM | Admit: 2020-04-01 | Discharge: 2020-04-01 | Disposition: A | Discharge status: Home / Self Care | Payer: BC Managed Care – PPO | Attending: Emergency Medicine | Admitting: Emergency Medicine

## 2020-07-25 ENCOUNTER — Encounter: Payer: Self-pay | Admitting: Emergency Medicine

## 2020-07-25 ENCOUNTER — Emergency Department
Admission: EM | Admit: 2020-07-25 | Discharge: 2020-07-25 | Disposition: A | Discharge status: Home / Self Care | Payer: BC Managed Care – PPO | Attending: Emergency Medicine | Admitting: Emergency Medicine

## 2020-07-25 ENCOUNTER — Other Ambulatory Visit: Payer: Self-pay

## 2020-07-25 ENCOUNTER — Emergency Department: Payer: BC Managed Care – PPO

## 2020-07-25 DIAGNOSIS — S0990XA Unspecified injury of head, initial encounter: Secondary | ICD-10-CM | POA: Diagnosis present

## 2020-07-25 DIAGNOSIS — G44319 Acute post-traumatic headache, not intractable: Secondary | ICD-10-CM

## 2020-07-25 DIAGNOSIS — F17210 Nicotine dependence, cigarettes, uncomplicated: Secondary | ICD-10-CM | POA: Diagnosis not present

## 2020-07-25 DIAGNOSIS — S161XXA Strain of muscle, fascia and tendon at neck level, initial encounter: Secondary | ICD-10-CM | POA: Diagnosis not present

## 2020-07-25 DIAGNOSIS — Y92410 Unspecified street and highway as the place of occurrence of the external cause: Secondary | ICD-10-CM | POA: Diagnosis not present

## 2020-07-25 DIAGNOSIS — Y9389 Activity, other specified: Secondary | ICD-10-CM | POA: Insufficient documentation

## 2020-07-25 MED ORDER — METHOCARBAMOL 500 MG PO TABS: 500.0000 mg | ORAL_TABLET | Freq: Four times a day (QID) | ORAL | 0 refills | Status: DC

## 2020-07-25 MED ORDER — HYDROCODONE-ACETAMINOPHEN 5-325 MG PO TABS: 1.0000 | ORAL_TABLET | Freq: Four times a day (QID) | ORAL | 0 refills | Status: DC | PRN
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
## 2020-07-25 NOTE — Discharge Instructions (Signed)
Follow-up with your primary care provider if any continued problems.  Begin taking the medication as directed.  The pain medication and muscle relaxant together Strite cause drowsiness and therefore you should not drive or operate machinery while taking this.  It Kozlowski be necessary that you only take this at bedtime.  You Ebel also use ice or heat to your neck as needed for discomfort.  If any worsening of your symptoms return to the emergency department.
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
## 2020-08-09 ENCOUNTER — Emergency Department
Admission: EM | Admit: 2020-08-09 | Discharge: 2020-08-09 | Discharge status: Against Medical Advice | Payer: BC Managed Care – PPO

## 2020-10-19 ENCOUNTER — Encounter: Payer: Self-pay | Admitting: Emergency Medicine

## 2020-10-19 ENCOUNTER — Emergency Department
Admission: EM | Admit: 2020-10-19 | Discharge: 2020-10-19 | Disposition: A | Discharge status: Home / Self Care | Payer: BC Managed Care – PPO | Attending: Emergency Medicine | Admitting: Emergency Medicine

## 2020-10-19 ENCOUNTER — Other Ambulatory Visit: Payer: Self-pay

## 2020-10-19 DIAGNOSIS — F17210 Nicotine dependence, cigarettes, uncomplicated: Secondary | ICD-10-CM | POA: Diagnosis not present

## 2020-10-19 DIAGNOSIS — H9201 Otalgia, right ear: Secondary | ICD-10-CM | POA: Diagnosis present

## 2020-10-19 DIAGNOSIS — H6501 Acute serous otitis media, right ear: Secondary | ICD-10-CM | POA: Diagnosis not present

## 2020-10-19 MED ORDER — FLUTICASONE PROPIONATE 50 MCG/ACT NA SUSP: 2.0000 | Freq: Every day | NASAL | 0 refills | Status: AC

## 2020-10-19 MED ORDER — DEXAMETHASONE SODIUM PHOSPHATE 10 MG/ML IJ SOLN
10.0000 mg | Freq: Once | INTRAMUSCULAR | Status: AC
  Administered 2020-10-19: 10 mg via INTRAMUSCULAR
  Filled 2020-10-19: qty 1

## 2020-10-19 NOTE — ED Triage Notes (Signed)
Pt comes into the ED via POV c/o right ear pain.  Pt has pressure and sharp pain  In the ear x 1 week.

## 2020-10-19 NOTE — ED Provider Notes (Signed)
[facility] Emergency Department Provider Note ____________________________________________  Time seen: 18  I have reviewed the triage vital signs and the nursing notes.  HISTORY  Chief Complaint  Otalgia  HPI Heather Walter is a 35 y.o. adult who presents himself to the ED for evaluation of right otalgia. He describes a fullness, popping, and ear fullness. He also noted post-nasal drip and decreased hearing on the right. He uses cotton swabs daily. He denies any otorrhea, tinnitus, vertigo, foreign body sensation, or syncope. He is without fever, chills, or sweats.   Past Medical History:  Diagnosis Date  . Bipolar 1 disorder (HCC)   . Kidney stone     There are no problems to display for this patient.   Past Surgical History:  Procedure Laterality Date  . knee arthoscopy      Prior to Admission medications   Medication Sig Start Date End Date Taking? Authorizing Provider  ARIPiprazole (ABILIFY PO) Take by mouth.    [provider]  diphenhydrAMINE (BENADRYL) 25 mg capsule Take 2 capsules (50 mg total) by mouth every 6 (six) hours as needed. 12/16/19   Sharman Cheek, MD  HYDROcodone-acetaminophen (NORCO/VICODIN) 5-325 MG tablet Take 1 tablet by mouth every 6 (six) hours as needed for moderate pain. 07/25/20   Tommi Rumps, PA-C  LamoTRIgine (LAMICTAL PO) Take by mouth.    [provider]  Lisdexamfetamine Dimesylate (VYVANSE PO) Take by mouth.    [provider]  LITHIUM PO Take by mouth.    [provider]  methocarbamol (ROBAXIN) 500 MG tablet Take 1 tablet (500 mg total) by mouth 4 (four) times daily. 07/25/20   Tommi Rumps, PA-C  naproxen (NAPROSYN) 500 MG tablet Take 1 tablet (500 mg total) by mouth 2 (two) times daily with a meal. 03/09/20   Joni Reining, PA-C  neomycin-polymyxin-pramoxine (NEOSPORIN PLUS) 1 % cream Apply topically 2 (two) times daily. 03/09/20   Joni Reining, PA-C     Allergies Fish allergy and Risperidone and related  History reviewed. No pertinent family history.  Social History Social History   Tobacco Use  . Smoking status: Current Every Day Smoker    Packs/day: 0.50    Types: Cigarettes  . Smokeless tobacco: Never Used  Substance Use Topics  . Alcohol use: No  . Drug use: No    Review of Systems  Constitutional: Negative for fever. Eyes: Negative for visual changes. ENT: Negative for sore throat. right otalgia Cardiovascular: Negative for chest pain. Respiratory: Negative for shortness of breath. Gastrointestinal: Negative for abdominal pain, vomiting and diarrhea. Musculoskeletal: Negative for back pain. Skin: Negative for rash. Neurological: Negative for headaches, focal weakness or numbness. ____________________________________________  PHYSICAL EXAM:  VITAL SIGNS: ED Triage Vitals  Enc Vitals Group     BP 10/19/20 1811 (!) 154/90     Pulse Rate 10/19/20 1811 (!) 126     Resp 10/19/20 1811 18     Temp 10/19/20 1811 98.6 F (37 C)     Temp Source 10/19/20 1811 Oral     SpO2 10/19/20 1811 99 %     Weight 10/19/20 1811 155 lb (70.3 kg)     Height 10/19/20 1811 5\' 4"  (1.626 m)     Head Circumference --      Peak Flow --      Pain Score 10/19/20 1826 7     Pain Loc --      Pain Edu? --      Excl.  in GC? --     Constitutional: Alert and oriented. Well appearing and in no distress. Head: Normocephalic and atraumatic. Eyes: Conjunctivae are normal. PERRL. Normal extraocular movements Ears: Canals clear. TMs intact bilaterally. Right TM intact, clear, mildly erythematous, dull, and retracted. No purulent effusion is appreciated. Nose: No congestion/rhinorrhea/epistaxis. Mouth/Throat: Mucous membranes are moist. Uvula is midline and tonsils are flat. Oropharyngeal erythema is noted globally. Cobblestone appearance of the posterior pharyngeal wall is noted. Neck: Supple. No  thyromegaly. Hematological/Lymphatic/Immunological: No cervical lymphadenopathy. Cardiovascular: Normal rate, regular rhythm. Normal distal pulses. Respiratory: Normal respiratory effort. No wheezes/rales/rhonchi. Gastrointestinal: Soft and nontender. No distention. Musculoskeletal: Nontender with normal range of motion in all extremities.  Neurologic:  Normal gait without ataxia. Normal speech and language. No gross focal neurologic deficits are appreciated. Skin:  Skin is warm, dry and intact. No rash noted. ____________________________________________  PROCEDURES  Decadron 10 mg PO  Procedures ____________________________________________  INITIAL IMPRESSION / ASSESSMENT AND PLAN / ED COURSE  DDX; AOM, swimmers ear, TM rupture, URI, sinusitis  Patient presents to the ED for evaluation of right ear fullness and intermittent pain.  Exam is overall benign return at this time.  No signs of acute otitis media or TM rupture.  Symptoms likely represent a serous effusion with associated rhinitis and postnasal drip.  Patient will be treated empirically with a Decadron injection in the ED.  He is encouraged to take over-the-counter decongestants, antihistamines, and prescription nasal spray.  He will follow-up with primary provider or [place] urgent care for ongoing symptoms.  Return precautions have been discussed.   Heather Walter was evaluated in Emergency Department on 10/19/2020 for the symptoms described in the history of present illness. He was evaluated in the context of the global COVID-19 pandemic, which necessitated consideration that the patient might be at risk for infection with the SARS-CoV-2 virus that causes COVID-19. Institutional protocols and algorithms that pertain to the evaluation of patients at risk for COVID-19 are in a state of rapid change based on information released by regulatory bodies including the CDC and federal and state organizations. These policies and algorithms  were followed during the patient's care in the ED. ____________________________________________  FINAL CLINICAL IMPRESSION(S) / ED DIAGNOSES  Final diagnoses:  Non-recurrent acute serous otitis media of right ear      Lissa Hoard, PA-C 10/19/20 2005    Minna Antis, MD 10/19/20 2232

## 2020-10-19 NOTE — Discharge Instructions (Signed)
Your exam is consistent with fluid collection behind the ear.  This is likely an associated symptom with your runny nose and postnasal drip.  Take over-the-counter allergy medicine plus decongestant (Allegra-D, Claritin-D, Zyrtec-D).  You should also use a prescription nasal steroid as directed.  Follow-up with your primary provider or [place] urgent care for ongoing symptoms.

## 2020-10-19 NOTE — ED Notes (Signed)
Pt left without d/c instructions. Called patient at listed contact number with no answer and left voicemail for return call.

## 2021-04-30 ENCOUNTER — Emergency Department
Admission: EM | Admit: 2021-04-30 | Discharge: 2021-04-30 | Discharge status: Against Medical Advice | Payer: No Typology Code available for payment source

## 2021-04-30 NOTE — ED Triage Notes (Signed)
EMS attempting to give RN reports. Pt cursing and yelling stating that he is not staying. Pt left ED WR

## 2021-05-03 ENCOUNTER — Emergency Department: Payer: No Typology Code available for payment source

## 2021-05-03 ENCOUNTER — Emergency Department
Admission: EM | Admit: 2021-05-03 | Discharge: 2021-05-03 | Disposition: A | Discharge status: Home / Self Care | Payer: No Typology Code available for payment source | Attending: Emergency Medicine | Admitting: Emergency Medicine

## 2021-05-03 ENCOUNTER — Other Ambulatory Visit: Payer: Self-pay

## 2021-05-03 ENCOUNTER — Encounter: Payer: Self-pay | Admitting: Emergency Medicine

## 2021-05-03 DIAGNOSIS — W231XXA Caught, crushed, jammed, or pinched between stationary objects, initial encounter: Secondary | ICD-10-CM | POA: Diagnosis not present

## 2021-05-03 DIAGNOSIS — F17210 Nicotine dependence, cigarettes, uncomplicated: Secondary | ICD-10-CM | POA: Diagnosis not present

## 2021-05-03 DIAGNOSIS — S0990XA Unspecified injury of head, initial encounter: Secondary | ICD-10-CM | POA: Diagnosis present

## 2021-05-03 DIAGNOSIS — S060X0A Concussion without loss of consciousness, initial encounter: Secondary | ICD-10-CM | POA: Diagnosis not present

## 2021-05-03 DIAGNOSIS — S060X9A Concussion with loss of consciousness of unspecified duration, initial encounter: Secondary | ICD-10-CM
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
## 2021-05-03 NOTE — ED Notes (Signed)
Patient states he needed to get to work and did not have time to get vitals rechecked. Patient ambulatory with steady gait to lobby

## 2021-05-03 NOTE — Discharge Instructions (Addendum)
Return to the ER for new, worsening, or persistent severe memory loss, dizziness, headaches, vomiting, or any other new or worsening symptoms that concern you.
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
## 2021-05-03 NOTE — ED Notes (Signed)
Patient up to first nurse desk asking for discharge papers. Reports he walked out of room because they were to slow when he kept calling out. Patient given in papers in lobby

## 2021-05-03 NOTE — ED Notes (Signed)
In MRI

## 2021-06-02 IMAGING — CT CT HEAD W/O CM
3 series · 15 of 47 positions shown, 18 images · non-contrast
Comparison: None.

CLINICAL DATA: Pain. Abrasion on top left side of head.

EXAM:
CT HEAD WITHOUT CONTRAST
CT CERVICAL SPINE WITHOUT CONTRAST
TECHNIQUE: Multidetector CT imaging of the head and cervical spine was
performed following the standard protocol without intravenous
contrast. Multiplanar CT image reconstructions of the cervical spine
were also generated.

[Series 2: head wo · axial · 0.41mm/px · z∈[-134,-9]mm · 9 of 30 slices shown, 12 images]
[im 3/30  brain]
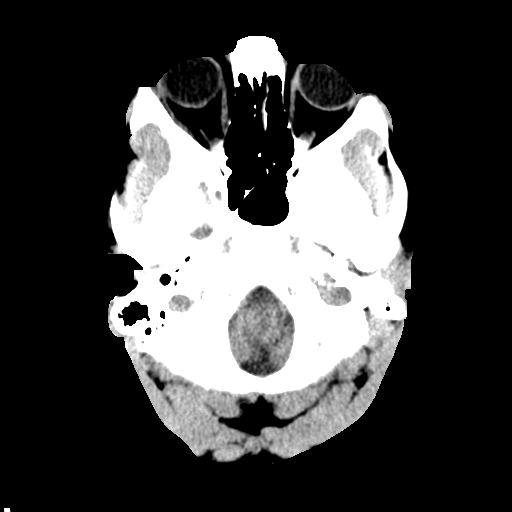
[im 3/30  bone]
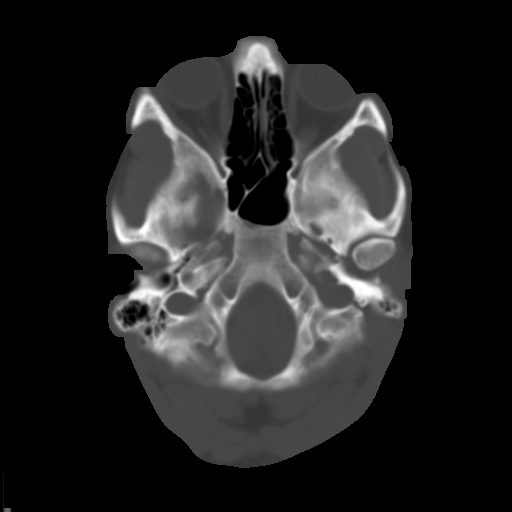
[im 6/30  brain]
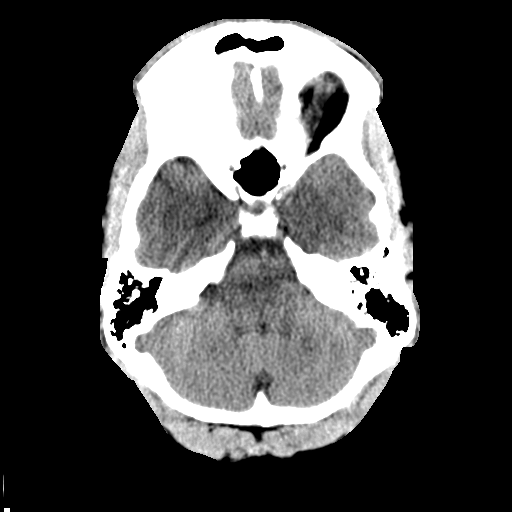
[im 9/30  brain]
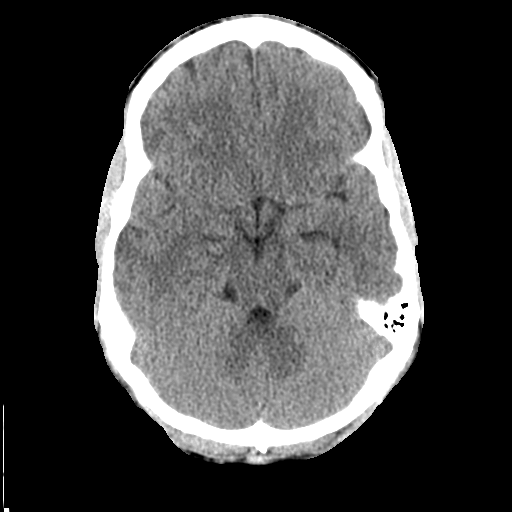
[im 12/30  brain]
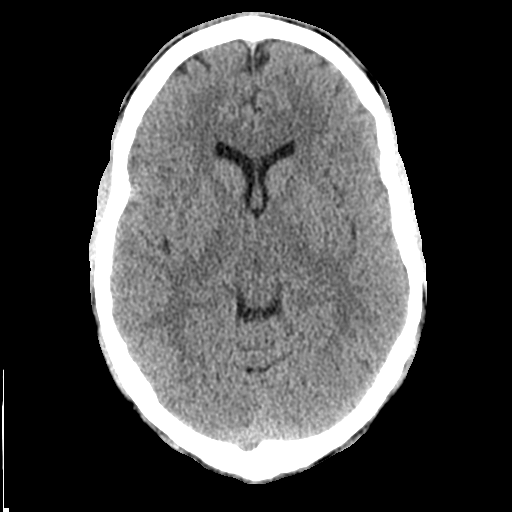
[im 16/30  brain]
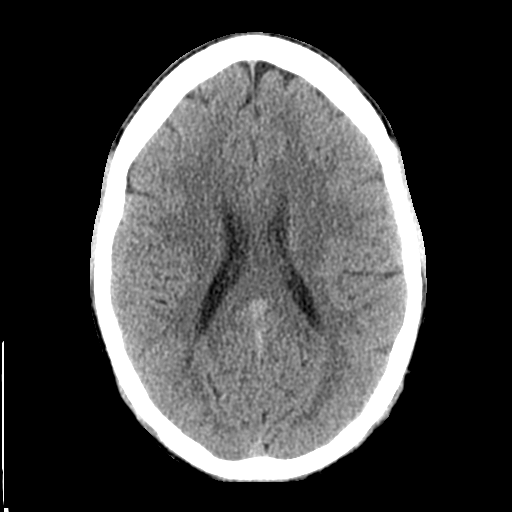
[im 16/30  bone]
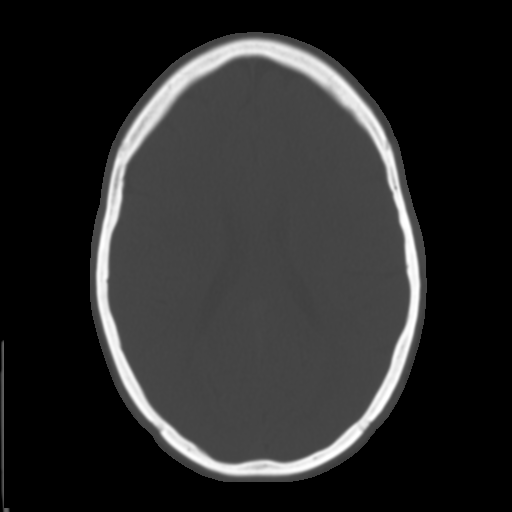
[im 19/30  brain]
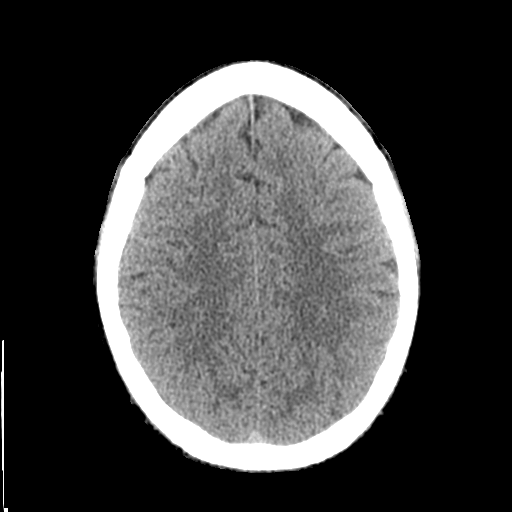
[im 22/30  brain]
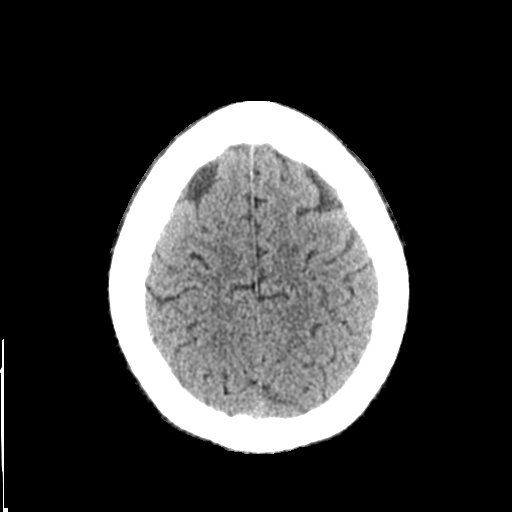
[im 25/30  brain]
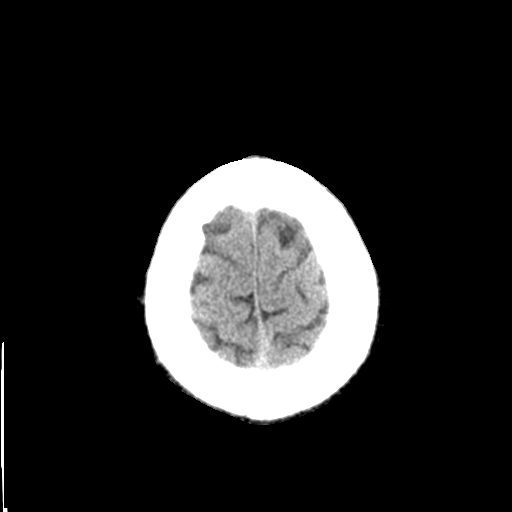
[im 28/30  brain]
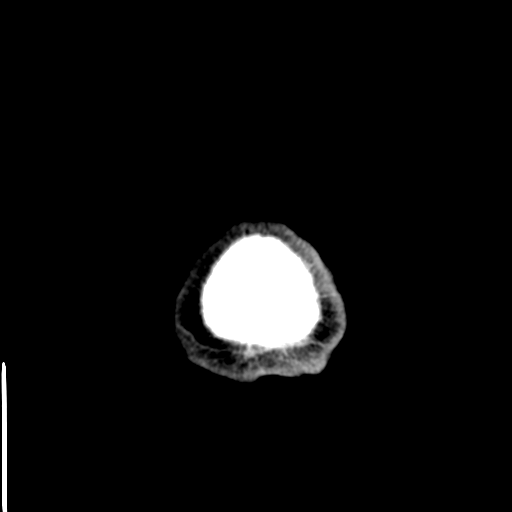
[im 28/30  bone]
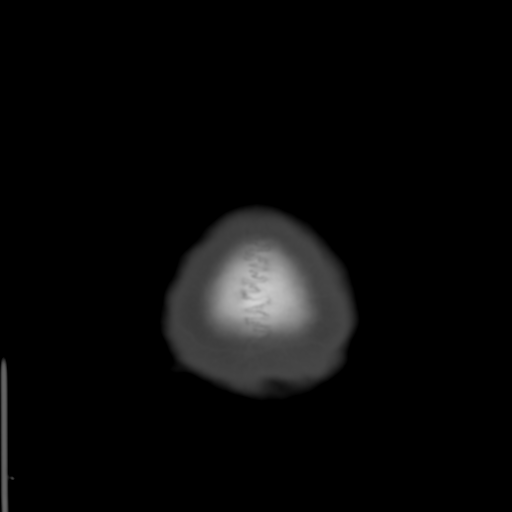

[Series 4: coronal soft tissue · coronal · 0.29mm/px · 3 of 65 slices shown]
[im 22/65  brain]
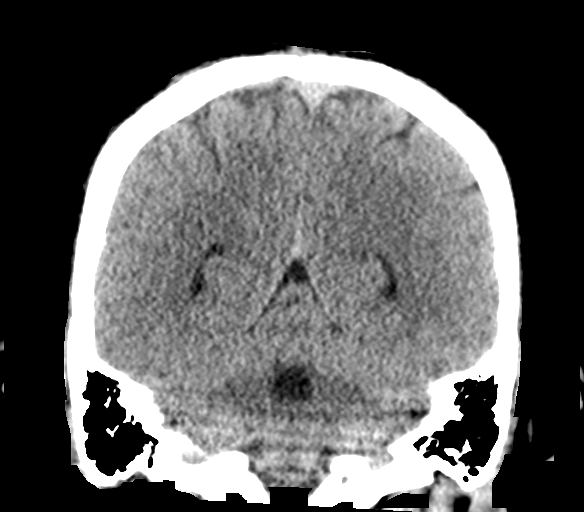
[im 29/65  brain]
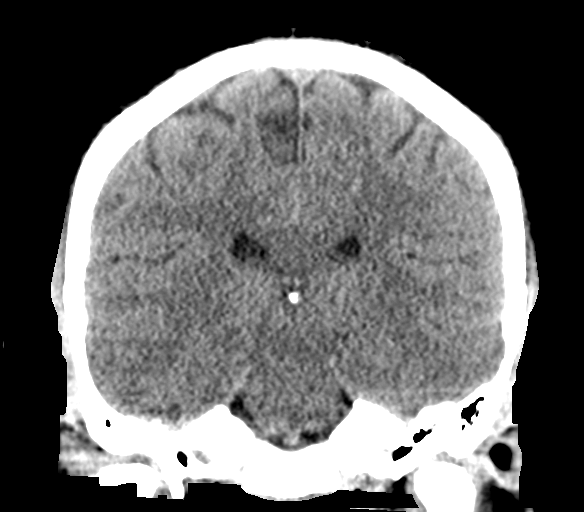
[im 36/65  brain]
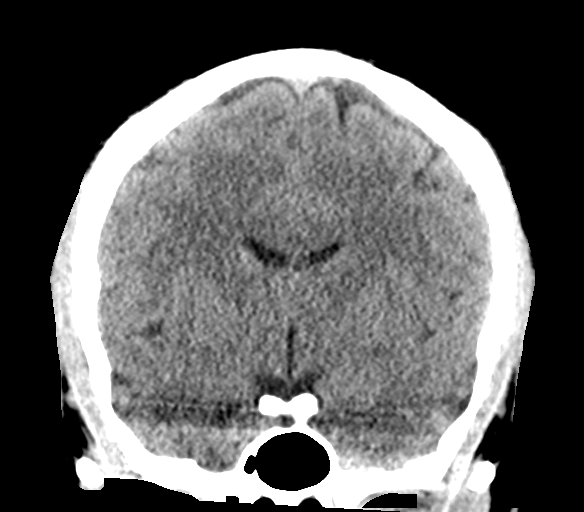

[Series 5: sagittal soft tissue · sagittal · 0.29mm/px · 3 of 53 slices shown]
[im 18/53  brain]
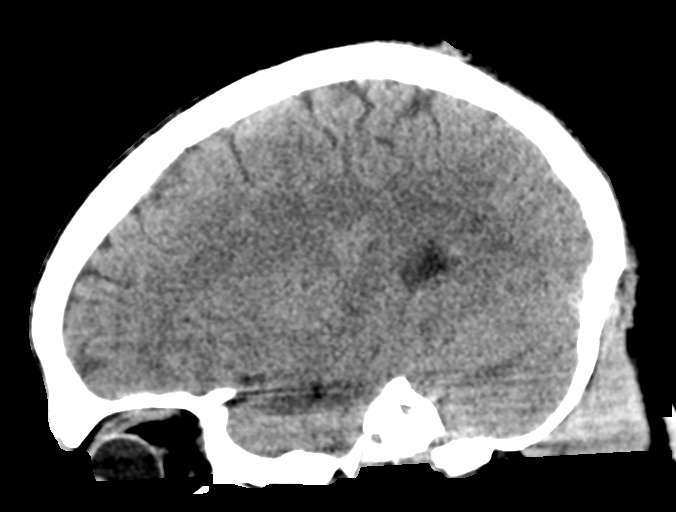
[im 27/53  brain]
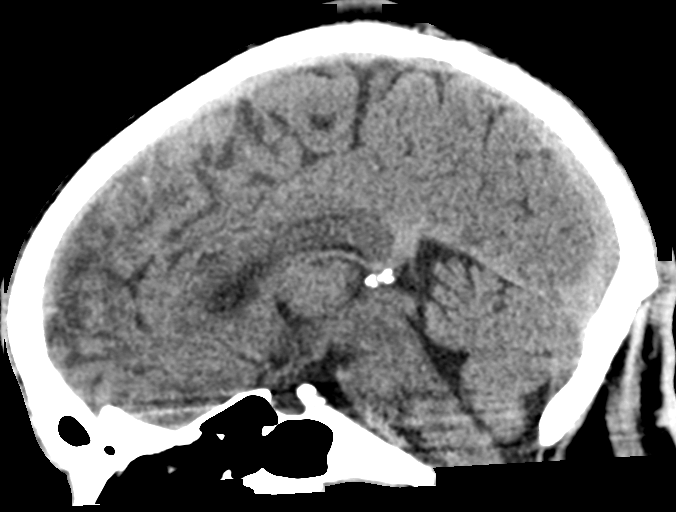
[im 35/53  brain]
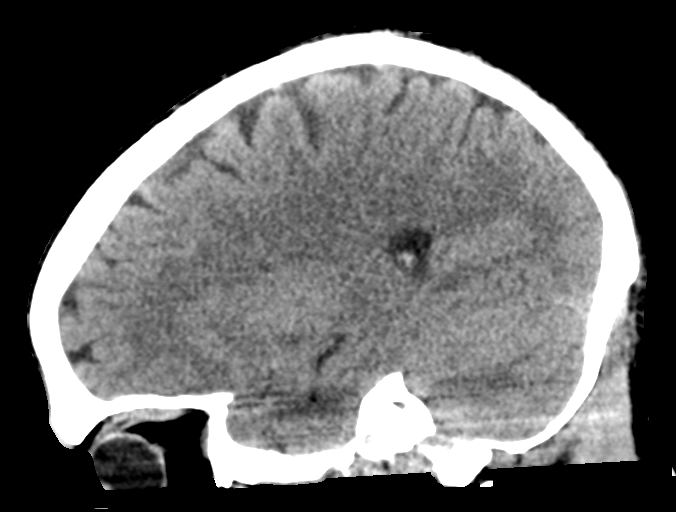

[15 of 47 positions shown; findings below may reference images not displayed]

FINDINGS: CT HEAD FINDINGS

Brain: No evidence of acute infarction, hemorrhage, hydrocephalus,
extra-axial collection or mass lesion/mass effect.

Vascular: No hyperdense vessel or unexpected calcification.

Skull: Normal. Negative for fracture or focal lesion.

Sinuses/Orbits: No acute finding.

Other: None.

CT CERVICAL SPINE FINDINGS

Alignment: Normal.

Skull base and vertebrae: No acute fracture. No primary bone lesion
or focal pathologic process.

Soft tissues and spinal canal: No prevertebral fluid or swelling. No
visible canal hematoma.

Disc levels: There are minimal degenerative changes of the cervical
spine, greatest at the C5-C6 level.

Upper chest: Negative.

Other: None
IMPRESSION: 1. Normal CT evaluation of the head.
2. No evidence of acute traumatic injury to the cervical spine.
3. Minimal degenerative changes of the cervical spine, greatest at
the C5-C6 level.

## 2021-06-02 IMAGING — CT CT CERVICAL SPINE W/O CM
3 of 4 series · 12 of 33 positions shown, 14 images · non-contrast
Comparison: None.

CLINICAL DATA: Pain. Abrasion on top left side of head.

EXAM:
CT HEAD WITHOUT CONTRAST
CT CERVICAL SPINE WITHOUT CONTRAST
TECHNIQUE: Multidetector CT imaging of the head and cervical spine was
performed following the standard protocol without intravenous
contrast. Multiplanar CT image reconstructions of the cervical spine
were also generated.

[Series 6: sagittal bone · sagittal · 0.29mm/px · 5 of 55 slices shown, 6 images]
[im 19/55  bone]
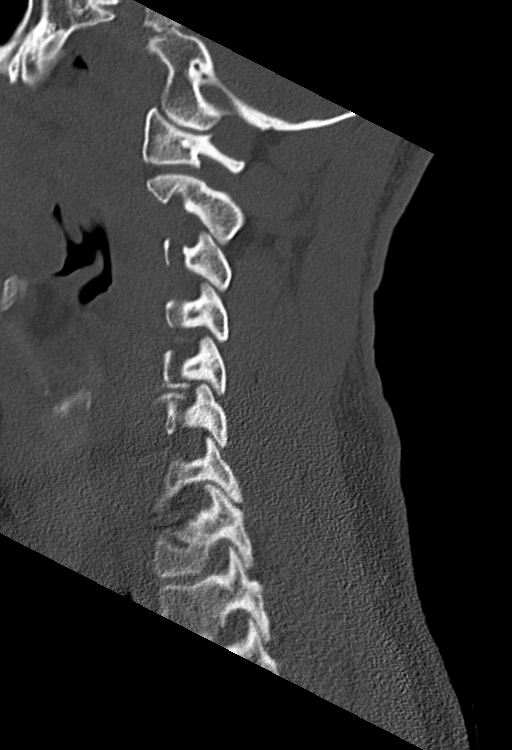
[im 23/55  bone]
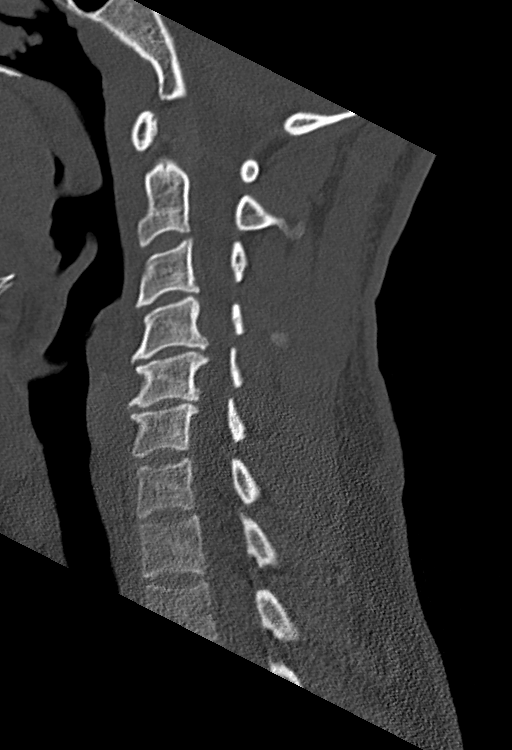
[im 28/55  soft-tissue]
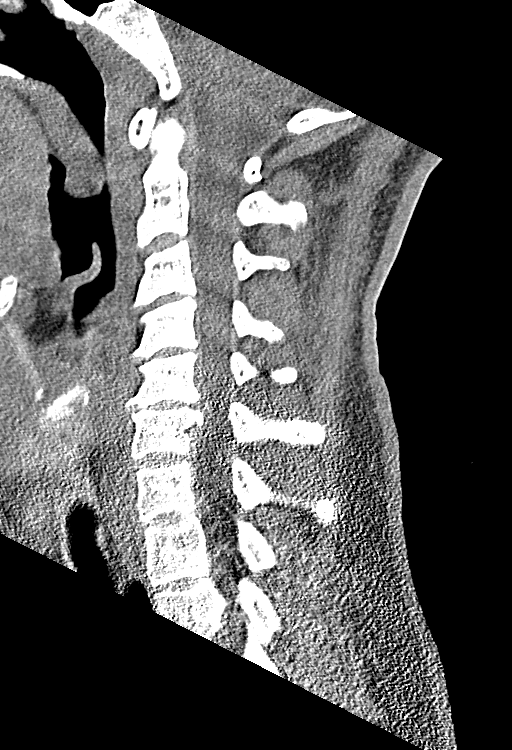
[im 28/55  bone]
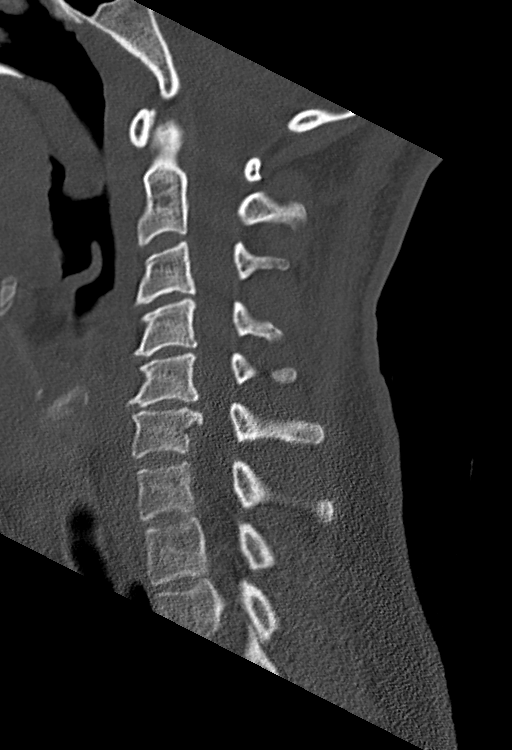
[im 32/55  bone]
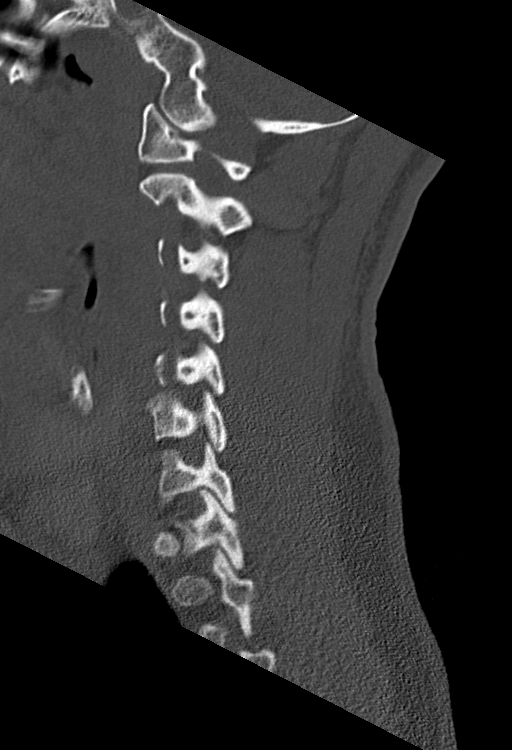
[im 37/55  bone]
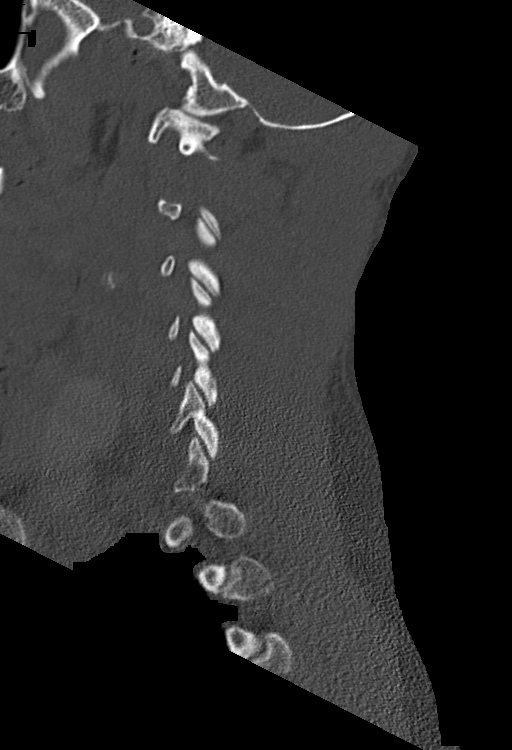

[Series 7: coronal bone · coronal · 0.23mm/px · 3 of 58 slices shown]
[im 13/58  bone]
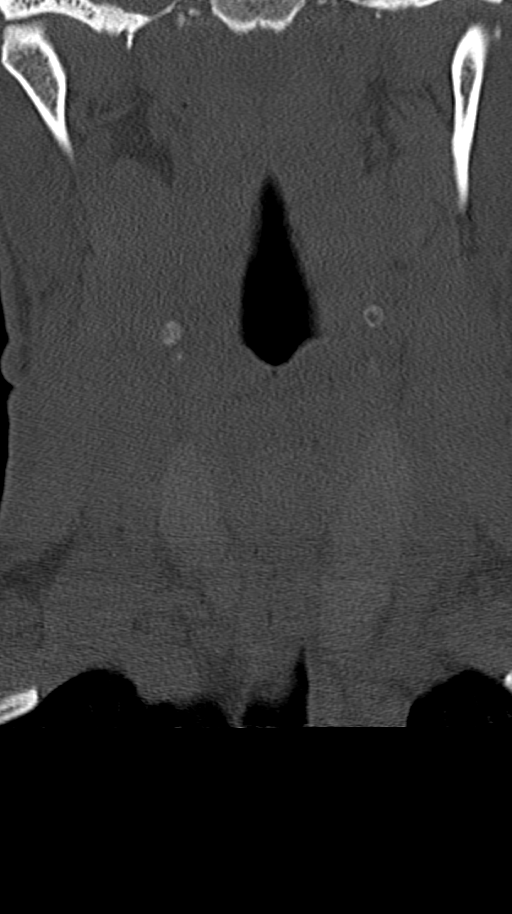
[im 24/58  bone]
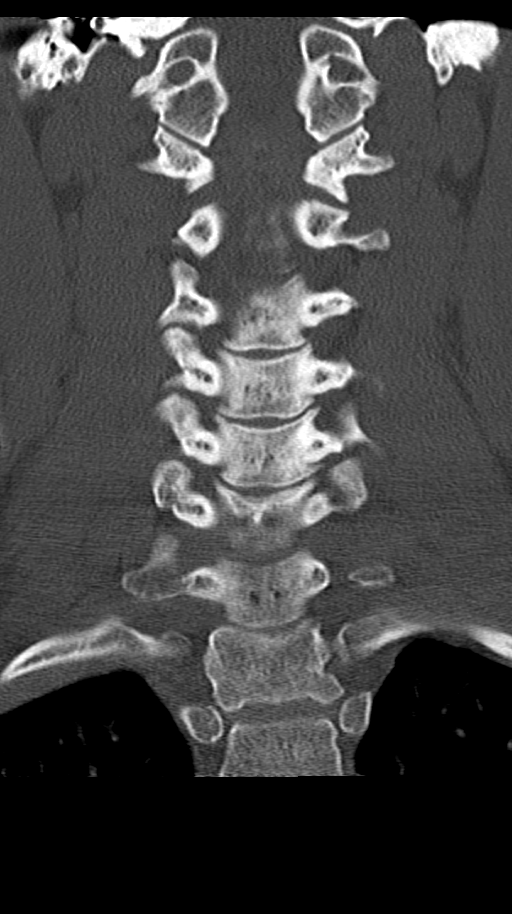
[im 35/58  bone]
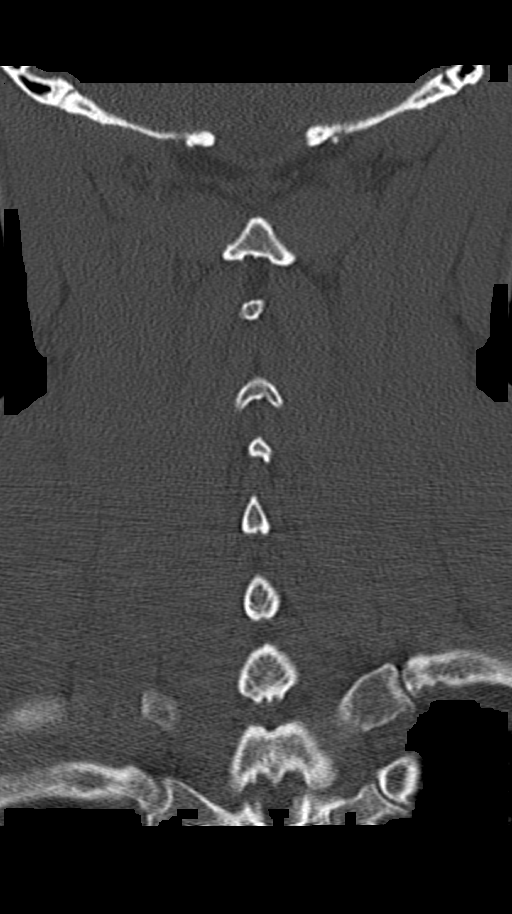

[Series 8: orthogonal bone · axial · 0.25mm/px · z∈[-309,-185]mm · 4 of 98 slices shown, 5 images]
[im 14/98  soft-tissue]
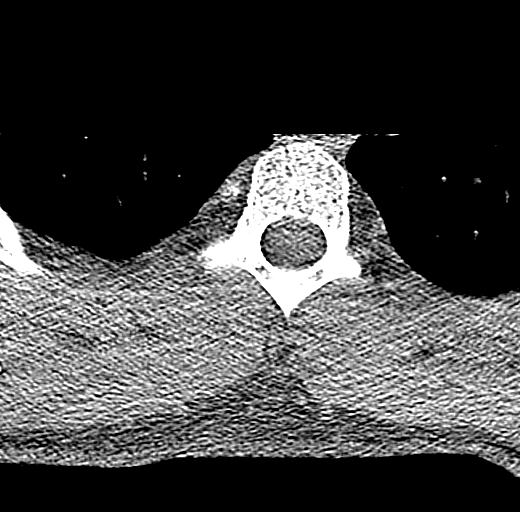
[im 14/98  bone]
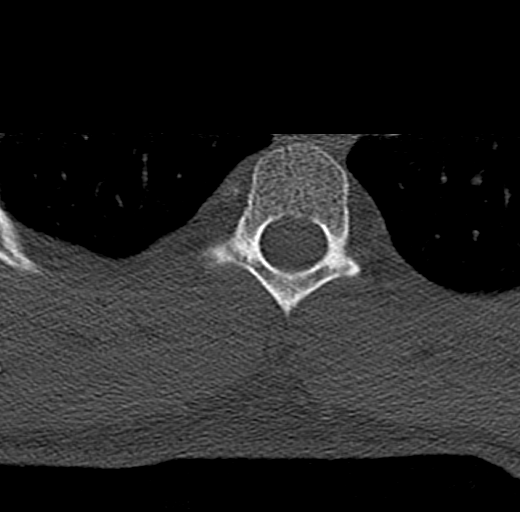
[im 42/98  bone]
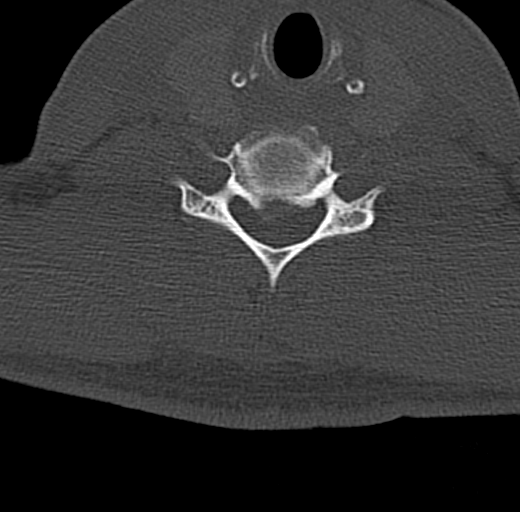
[im 56/98  bone]
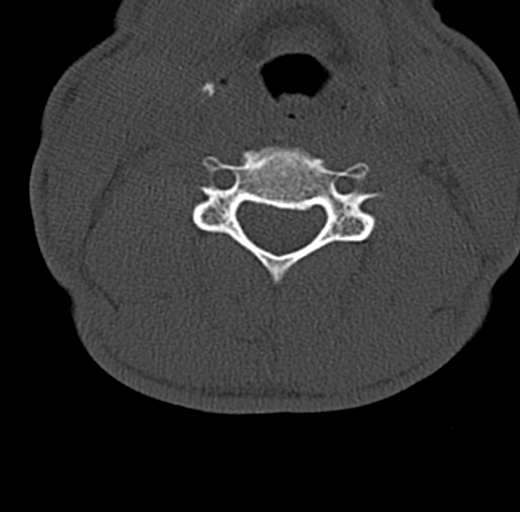
[im 84/98  bone]
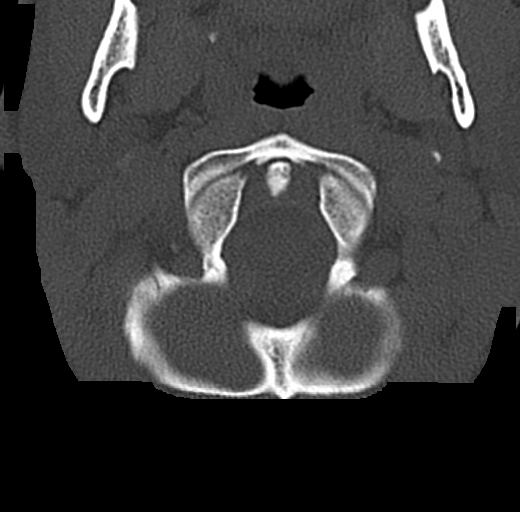

[12 of 33 positions shown; findings below may reference images not displayed]

FINDINGS: CT HEAD FINDINGS

Brain: No evidence of acute infarction, hemorrhage, hydrocephalus,
extra-axial collection or mass lesion/mass effect.

Vascular: No hyperdense vessel or unexpected calcification.

Skull: Normal. Negative for fracture or focal lesion.

Sinuses/Orbits: No acute finding.

Other: None.

CT CERVICAL SPINE FINDINGS

Alignment: Normal.

Skull base and vertebrae: No acute fracture. No primary bone lesion
or focal pathologic process.

Soft tissues and spinal canal: No prevertebral fluid or swelling. No
visible canal hematoma.

Disc levels: There are minimal degenerative changes of the cervical
spine, greatest at the C5-C6 level.

Upper chest: Negative.

Other: None
IMPRESSION: 1. Normal CT evaluation of the head.
2. No evidence of acute traumatic injury to the cervical spine.
3. Minimal degenerative changes of the cervical spine, greatest at
the C5-C6 level.

## 2021-11-09 ENCOUNTER — Emergency Department (HOSPITAL_COMMUNITY): Payer: Self-pay

## 2021-11-09 ENCOUNTER — Encounter (HOSPITAL_COMMUNITY): Payer: Self-pay | Admitting: Emergency Medicine

## 2021-11-09 ENCOUNTER — Emergency Department (HOSPITAL_COMMUNITY)
Admission: EM | Admit: 2021-11-09 | Discharge: 2021-11-09 | Disposition: A | Discharge status: Home / Self Care | Payer: Self-pay | Attending: Emergency Medicine | Admitting: Emergency Medicine

## 2021-11-09 DIAGNOSIS — M549 Dorsalgia, unspecified: Secondary | ICD-10-CM | POA: Insufficient documentation

## 2021-11-09 DIAGNOSIS — R079 Chest pain, unspecified: Secondary | ICD-10-CM | POA: Insufficient documentation

## 2021-11-09 DIAGNOSIS — E119 Type 2 diabetes mellitus without complications: Secondary | ICD-10-CM | POA: Insufficient documentation

## 2021-11-09 DIAGNOSIS — R110 Nausea: Secondary | ICD-10-CM | POA: Insufficient documentation

## 2021-11-09 DIAGNOSIS — I10 Essential (primary) hypertension: Secondary | ICD-10-CM | POA: Insufficient documentation

## 2021-11-09 DIAGNOSIS — R202 Paresthesia of skin: Secondary | ICD-10-CM | POA: Insufficient documentation

## 2021-11-09 DIAGNOSIS — N9489 Other specified conditions associated with female genital organs and menstrual cycle: Secondary | ICD-10-CM | POA: Insufficient documentation

## 2021-11-09 DIAGNOSIS — I2510 Atherosclerotic heart disease of native coronary artery without angina pectoris: Secondary | ICD-10-CM | POA: Insufficient documentation

## 2021-11-09 LAB — BASIC METABOLIC PANEL
Anion gap: 7 (ref 5–15)
BUN: 16 mg/dL (ref 6–20)
CO2: 27 mmol/L (ref 22–32)
Calcium: 9.8 mg/dL (ref 8.9–10.3)
Chloride: 105 mmol/L (ref 98–111)
Creatinine, Ser: 0.84 mg/dL (ref 0.44–1.00)
GFR, Estimated: >60 mL/min (ref >60)
Glucose, Bld: 107 mg/dL — ABNORMAL HIGH (ref 70–99)
Potassium: 5.7 mmol/L — ABNORMAL HIGH (ref 3.5–5.1)
Sodium: 139 mmol/L (ref 135–145)

## 2021-11-09 LAB — CBC
HCT: 46.9 % — ABNORMAL HIGH (ref 36.0–46.0)
Hemoglobin: 14.9 g/dL (ref 12.0–15.0)
MCH: 31.6 pg (ref 26.0–34.0)
MCHC: 31.8 g/dL (ref 30.0–36.0)
MCV: 99.6 fL (ref 80.0–100.0)
Platelets: 343 10*3/uL (ref 150–400)
RBC: 4.71 MIL/uL (ref 3.87–5.11)
RDW: 13.1 % (ref 11.5–15.5)
WBC: 9.3 10*3/uL (ref 4.0–10.5)
nRBC: 0 % (ref 0.0–0.2)

## 2021-11-09 LAB — I-STAT BETA HCG BLOOD, ED (MC, WL, AP ONLY): I-stat hCG, quantitative: <5 m[IU]/mL (ref <5)

## 2021-11-09 LAB — TROPONIN I (HIGH SENSITIVITY): Troponin I (High Sensitivity): 6 ng/L (ref <18)

## 2021-11-09 MED ORDER — IBUPROFEN 400 MG PO TABS
600.0000 mg | ORAL_TABLET | Freq: Once | ORAL | Status: DC
  Filled 2021-11-09: qty 1

## 2021-11-09 NOTE — ED Triage Notes (Signed)
Pt. Stated, Heather Walter been having chest pain that's stabbing that goes to my back and shoulder , the whole thing started about a month ago.
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
## 2021-11-09 NOTE — ED Notes (Signed)
Pt refuses to be on the cardiac monitor. I explained to pt since they are her for chest pain, that we needed to monitor the heart, but he still refused.

## 2021-11-09 NOTE — Discharge Instructions (Addendum)
Follow-up with your primary care in 2 weeks. Return for worsening chest pain, chest pain that is worse with inspiration. You will receive a call if the blood cultures grow back any bacteria, if that happens please return back to the ED as advised.

## 2021-11-09 NOTE — ED Notes (Signed)
Pt refused last set of VS and left before blood cultures were drawn.

## 2021-11-09 NOTE — ED Notes (Signed)
Pt to XR

## 2021-11-09 NOTE — ED Provider Notes (Signed)
[facility] EMERGENCY DEPARTMENT Provider Note   CSN: [id number] Arrival date & time: 11/09/21  [id number]     History  Chief Complaint  Patient presents with   Chest Pain   Nausea   Back Pain   Shoulder Pain    Heather Walter is a 36 y.o. adult.   Chest Pain Associated symptoms: back pain   Back Pain Associated symptoms: chest pain   Shoulder Pain Associated symptoms: back pain    Patient with medical history of female identity, female gender assigned at birth, bipolar 1 presents with chest pain.  Chest pain started roughly 1 month 1 to 2 months ago, its intermittent.  It started happening more frequently 2 weeks ago.  It is left-sided under his rib, chart.  The last few days he started noticing it to his chest as well as his back.  It is sharp, worse with movement and with sexual intercourse.  Nausea started last night, also having bilateral paresthesias to his hands.  He is not having these intermittently for months.  Patient does not have a personal history of hypertension, hyperlipidemia, diabetes, CAD, recent travel or surgeries.  Does smokes cigarettes daily, family history of CAD before the age of 39 per patient..  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  Denies any and injection drugs but endorses snorting meth until 1 week ago.  Patient states she stopped smoking meth due to the chest pain she has been experiencing.  Home Medications Prior to Admission medications   Medication Sig Start Date End Date Taking? Authorizing Provider  ARIPiprazole (ABILIFY PO) Take by mouth.    [provider]  diphenhydrAMINE (BENADRYL) 25 mg capsule Take 2 capsules (50 mg total) by mouth every 6 (six) hours as needed. 12/16/19   Carrie Mew, MD  fluticasone [facility]) 50 MCG/ACT nasal spray Place 2 sprays into both nostrils daily. 10/19/20   Menshew, Dannielle Karvonen, PA-C  HYDROcodone-acetaminophen (NORCO/VICODIN) 5-325 MG tablet Take 1 tablet  by mouth every 6 (six) hours as needed for moderate pain. 07/25/20   Johnn Hai, PA-C  LamoTRIgine (LAMICTAL PO) Take by mouth.    [provider]  Lisdexamfetamine Dimesylate (VYVANSE PO) Take by mouth.    [provider]  LITHIUM PO Take by mouth.    [provider]  methocarbamol (ROBAXIN) 500 MG tablet Take 1 tablet (500 mg total) by mouth 4 (four) times daily. 07/25/20   Johnn Hai, PA-C  naproxen (NAPROSYN) 500 MG tablet Take 1 tablet (500 mg total) by mouth 2 (two) times daily with a meal. 03/09/20   Sable Feil, PA-C  neomycin-polymyxin-pramoxine (NEOSPORIN PLUS) 1 % cream Apply topically 2 (two) times daily. 03/09/20   Sable Feil, PA-C      Allergies    Fish allergy and Risperidone and related    Review of Systems   Review of Systems  Cardiovascular:  Positive for chest pain.  Musculoskeletal:  Positive for back pain.   Physical Exam Updated Vital Signs BP (!) 142/74 (BP Location: Right Arm)    Pulse 85    Temp 98.5 F (36.9 C) (Oral)    Resp (!) 22    SpO2 100%  Physical Exam Vitals and nursing note reviewed. Exam conducted with a chaperone present.  Constitutional:      Appearance: Normal appearance.  HENT:     Head: Normocephalic and atraumatic.  Eyes:     General: No scleral icterus.  Right eye: No discharge.        Left eye: No discharge.     Extraocular Movements: Extraocular movements intact.     Pupils: Pupils are equal, round, and reactive to light.  Cardiovascular:     Rate and Rhythm: Normal rate and regular rhythm.     Pulses: Normal pulses.     Heart sounds: Normal heart sounds. No murmur heard.   No friction rub. No gallop.  Pulmonary:     Effort: Pulmonary effort is normal. No respiratory distress.     Breath sounds: Normal breath sounds.  Chest:     Chest wall: Tenderness present.  Abdominal:     General: Abdomen is flat. Bowel sounds are normal. There is no distension.     Palpations: Abdomen is  soft.     Tenderness: There is no abdominal tenderness.  Musculoskeletal:     Right lower leg: No edema.     Left lower leg: No edema.  Skin:    General: Skin is warm and dry.     Coloration: Skin is not jaundiced.  Neurological:     Mental Status: He is alert. Mental status is at baseline.     Coordination: Coordination normal.    ED Results / Procedures / Treatments   Labs (all labs ordered are listed, but only abnormal results are displayed) Labs Reviewed  BASIC METABOLIC PANEL - Abnormal; Notable for the following components:      Result Value   Potassium 5.7 (*)    Glucose, Bld 107 (*)    All other components within normal limits  CBC - Abnormal; Notable for the following components:   HCT 46.9 (*)    All other components within normal limits  CULTURE, BLOOD (ROUTINE X 2)  CULTURE, BLOOD (ROUTINE X 2)  I-STAT BETA HCG BLOOD, ED ([facility], [facility], [facility] ONLY)  TROPONIN I (HIGH SENSITIVITY)    EKG EKG Interpretation  Date/Time:  Thursday November 09 2021 08:45:27 EST Ventricular Rate:  79 PR Interval:    QRS Duration: 72 QT Interval:  318 QTC Calculation: 364 R Axis:   74 Text Interpretation: Normal sinus rhythm No previous ECGs available No old tracing to compare Confirmed by Deno Etienne [phone]) on 11/09/2021 9:45:24 AM  Radiology DG Chest 2 View  Result Date: 11/09/2021 CLINICAL DATA:  Chest pain EXAM: CHEST - 2 VIEW COMPARISON:  None. FINDINGS: The heart size and mediastinal contours are within normal limits. Both lungs are clear. The visualized skeletal structures are unremarkable. IMPRESSION: No active cardiopulmonary disease. Electronically Signed   By: Kathreen Devoid M.D.   On: 11/09/2021 10:24    Procedures Procedures    Medications Ordered in ED Medications  ibuprofen (ADVIL) tablet 600 mg (600 mg Oral Patient Refused/Not Given 11/09/21 1023)    ED Course/ Medical Decision Making/ A&P                           Medical Decision Making Amount and/or Complexity of  Data Reviewed Labs: ordered. Radiology: ordered.   This patient presents to the ED for concern of chest pain, this involves an extensive number of treatment options, and is a complaint that carries with it a high risk of complications and morbidity.  The differential diagnosis includes costochondritis, pericarditis, ACS, pneumonia, dissection, GERD, other   Co morbidities that complicate the patient evaluation: N/A   Additional history obtained: -Additional history obtained from friend -External records from outside source obtained and  reviewed including: Chart review including previous notes, labs, imaging, consultation notes   Lab Tests: -I ordered, reviewed, and interpreted labs.  The pertinent results include:  No leukocytosis, no anemia.  Troponin low at 6, no gross electrolyte derangement.  There is hemolysis in the tube, potassium is elevated at 5.7 but low suspicion for an acute renal failure given setting of normal creatinine.  Advised patient to recheck with PCP next week.   EKG -NSR, no overt ischemic findings.    Imaging Studies ordered: -I ordered imaging studies including chest xray   -I independently visualized and interpreted imaging which showed normal cardiac silhouette -I agree with the radiologist interpretation   Medicines ordered and prescription drug management: -I ordered medication including Motrin for pain -Reevaluation of the patient after these medicines showed that the patient declined. -I have reviewed the patients home medicines and have made adjustments as needed   ED Course: Heartscore 1.  No hypoxia, no tachypnea or tachycardia.  PE is a consideration in the setting of hormone use and cigarette smoking, however it is sharp and reproducible.  Is been going on for greater than 2 months and does not appear to be pleuritic in nature.  Does not appear consistent with an acute PE.  Endocarditis was also considered in the setting of drug use and chest  pain, there is no new murmur auscultated.  Blood cultures ordered which will be followed up on but I do not feel patient needs admission for emergent observation or emergent echocardiogram at this time.  Not febrile, no leukocytosis.  Additionally, given no ischemic findings on EKG and low troponin I doubt this is ACS.  Presentation is not consistent with an acute dissection.  Discussed broad differential with the patient, he is agreeable to outpatient follow-up, has an appointment with his PCP in 2 weeks from today.  Return precautions discussed and agreed on, discharged in stable condition.    Cardiac Monitoring: The patient was maintained on a cardiac monitor.  I personally viewed and interpreted the cardiac monitored which showed an underlying rhythm of: NSR   Reevaluation: After the interventions noted above, I reevaluated the patient and found that they have :stayed the same   Dispostion: -D/C         Final Clinical Impression(s) / ED Diagnoses Final diagnoses:  Chest pain, unspecified type    Rx / DC Orders ED Discharge Orders     None         Sherrill Raring, PA-C 11/09/21 [place], DO 11/09/21 1501

## 2022-01-22 ENCOUNTER — Emergency Department
Admission: EM | Admit: 2022-01-22 | Discharge: 2022-01-22 | Disposition: A | Discharge status: Home / Self Care | Payer: Medicaid Other | Attending: Emergency Medicine | Admitting: Emergency Medicine

## 2022-01-22 ENCOUNTER — Other Ambulatory Visit: Payer: Self-pay

## 2022-01-22 ENCOUNTER — Encounter: Payer: Self-pay | Admitting: Emergency Medicine

## 2022-01-22 DIAGNOSIS — K0889 Other specified disorders of teeth and supporting structures: Secondary | ICD-10-CM

## 2022-01-22 MED ORDER — LIDOCAINE VISCOUS HCL 2 % MT SOLN: OROMUCOSAL | 0 refills | Status: AC

## 2022-01-22 MED ORDER — AMOXICILLIN 875 MG PO TABS: 875.0000 mg | ORAL_TABLET | Freq: Two times a day (BID) | ORAL | 0 refills | Status: DC

## 2022-01-22 MED ORDER — CLINDAMYCIN HCL 300 MG PO CAPS: 300.0000 mg | ORAL_CAPSULE | Freq: Three times a day (TID) | ORAL | 0 refills | Status: AC

## 2022-01-22 NOTE — Discharge Instructions (Addendum)
Follow-up with the dental clinic.  A list of the hours for the walk-in clinic was given to you on a separate sheet of paper.  A prescription for clindamycin twice daily was sent to your pharmacy along with some lidocaine 2% solution to apply to a cottonball and then applied to your tooth to see if this helps with pain.  You Chavana continue taking Tylenol and ibuprofen as needed for pain. ?

## 2022-01-22 NOTE — ED Provider Notes (Signed)
? ?[facility] ?Provider Note ? ? ? Event Date/Time  ? First MD Initiated Contact with Patient 01/22/22 1352   ?  (approximate) ? ? ?History  ? ?Dental Pain (/) ? ? ?HPI ? ?Heather Walter is a 36 y.o. adult   presents to the ED with complaint of dental pain for 1 year but worse in the last several days.  Denies any fever or chills.  He has been using over-the-counter medication topically and orally to help control his dental pain.  He has already been told that he needs to see an oral Careers adviser. ? ?  ? ? ?Physical Exam  ? ?Triage Vital Signs: ?ED Triage Vitals  ?Enc Vitals Group  ?   BP 01/22/22 1342 140/79  ?   Pulse Rate 01/22/22 1342 92  ?   Resp 01/22/22 1342 15  ?   Temp 01/22/22 1342 98.8 ?F (37.1 ?C)  ?   Temp Source 01/22/22 1342 Oral  ?   SpO2 01/22/22 1342 96 %  ?   Weight 01/22/22 1343 134 lb 14.7 oz (61.2 kg)  ?   Height 01/22/22 1343 5\' 4"  (1.626 m)  ?   Head Circumference --   ?   Peak Flow --   ?   Pain Score --   ?   Pain Loc --   ?   Pain Edu? --   ?   Excl. in GC? --   ? ? ?Most recent vital signs: ?Vitals:  ? 01/22/22 1342  ?BP: 140/79  ?Pulse: 92  ?Resp: 15  ?Temp: 98.8 ?F (37.1 ?C)  ?SpO2: 96%  ? ? ? ?General: Awake, no distress.  ?CV:  Good peripheral perfusion.  ?Resp:  Normal effort.  ?Abd:  No distention.  ?Other:  Left lower posterior molar with tenderness and edematous gums surrounding.  No obvious abscess is noted. ? ? ?ED Results / Procedures / Treatments  ? ?Labs ?(all labs ordered are listed, but only abnormal results are displayed) ?Labs Reviewed - No data to display ? ? ? ?PROCEDURES: ? ?Critical Care performed:  ? ?Procedures ? ? ?MEDICATIONS ORDERED IN ED: ?Medications - No data to display ? ? ?IMPRESSION / MDM / ASSESSMENT AND PLAN / ED COURSE  ?I reviewed the triage vital signs and the nursing notes. ? ? ?Differential diagnosis includes, but is not limited to, dental pain, dental carry, dental abscess. ? ?36 year old female presents to the ED with complaint of  dental pain.  Patient has been told in the past that he needs to see an oral surgeon to have this particular tooth removed and he has had problems with his wisdom teeth.  Patient has used over-the-counter medications without any relief of his pain.  There is no obvious deformity or abscess noted.  Patient plans to see the dentist at the walk-in clinic this week.  A prescription for amoxicillin 875 twice daily was sent to the pharmacy along with lidocaine viscus to apply to a cottonball and put on his tooth. ? ? ? ? ?FINAL CLINICAL IMPRESSION(S) / ED DIAGNOSES  ? ?Final diagnoses:  ?Pain, dental  ? ? ? ?Rx / DC Orders  ? ?ED Discharge Orders   ? ?      Ordered  ?  amoxicillin (AMOXIL) 875 MG tablet  2 times daily,   Status:  Discontinued       ? 01/22/22 1416  ?  lidocaine (XYLOCAINE) 2 % solution       ?  01/22/22 1416  ?  clindamycin (CLEOCIN) 300 MG capsule  3 times daily       ? 01/22/22 1431  ? ?  ?  ? ?  ? ? ? ?Note:  This document was prepared using Dragon voice recognition software and Dambrosia include unintentional dictation errors. ?  ?Tommi Rumps, PA-C ?01/22/22 1434 ? ?  ?Jene Every, MD ?01/22/22 1554 ? ?

## 2022-03-28 ENCOUNTER — Emergency Department: Payer: Medicaid Other

## 2022-03-28 ENCOUNTER — Other Ambulatory Visit: Payer: Self-pay

## 2022-03-28 ENCOUNTER — Emergency Department
Admission: EM | Admit: 2022-03-28 | Discharge: 2022-03-28 | Disposition: A | Discharge status: Home / Self Care | Payer: Self-pay | Attending: Emergency Medicine | Admitting: Emergency Medicine

## 2022-03-28 DIAGNOSIS — S60512A Abrasion of left hand, initial encounter: Secondary | ICD-10-CM | POA: Insufficient documentation

## 2022-03-28 DIAGNOSIS — S0101XA Laceration without foreign body of scalp, initial encounter: Secondary | ICD-10-CM | POA: Insufficient documentation

## 2022-03-28 DIAGNOSIS — M62838 Other muscle spasm: Secondary | ICD-10-CM | POA: Insufficient documentation

## 2022-03-28 DIAGNOSIS — S0990XA Unspecified injury of head, initial encounter: Secondary | ICD-10-CM

## 2022-03-28 MED ORDER — ONDANSETRON 4 MG PO TBDP
4.0000 mg | ORAL_TABLET | Freq: Once | ORAL | Status: AC
Start: 2022-03-28 — End: 2022-03-28
  Administered 2022-03-28: 4 mg via ORAL
  Filled 2022-03-28: qty 1

## 2022-03-28 MED ORDER — ONDANSETRON 4 MG PO TBDP: 4.0000 mg | ORAL_TABLET | Freq: Three times a day (TID) | ORAL | 0 refills | Status: AC | PRN

## 2022-03-28 MED ORDER — OXYCODONE-ACETAMINOPHEN 5-325 MG PO TABS
1.0000 | ORAL_TABLET | Freq: Once | ORAL | Status: AC
  Administered 2022-03-28: 1 via ORAL
  Filled 2022-03-28: qty 1

## 2022-03-28 MED ORDER — BACITRACIN ZINC 500 UNIT/GM EX OINT
TOPICAL_OINTMENT | Freq: Once | CUTANEOUS | Status: AC
  Filled 2022-03-28: qty 0.9

## 2022-03-28 MED ORDER — OXYCODONE-ACETAMINOPHEN 5-325 MG PO TABS: 1.0000 | ORAL_TABLET | ORAL | 0 refills | Status: AC | PRN
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
## 2022-03-28 NOTE — ED Notes (Signed)
Bacitracin and dressing applied to laceration to left palm

## 2022-03-28 NOTE — ED Notes (Signed)
Pt discharge information reviewed. Pt understands need for follow up care and when to return if symptoms worsen. All questions answered. Pt is alert and oriented with even and regular respirations. Pt is seen ambulating out of department with string steady gait.   

## 2022-03-28 NOTE — Discharge Instructions (Signed)
You Wolfinger take pain & nausea medicine as needed. Return to the ER for worsening symptoms, persistent vomiting, lethargy, increased redness/swelling/purulent discharge to left palm or other concerns.

## 2022-07-10 ENCOUNTER — Other Ambulatory Visit: Payer: Self-pay

## 2022-07-10 ENCOUNTER — Emergency Department (HOSPITAL_BASED_OUTPATIENT_CLINIC_OR_DEPARTMENT_OTHER)
Admission: EM | Admit: 2022-07-10 | Discharge: 2022-07-10 | Disposition: A | Discharge status: Home / Self Care | Payer: Medicaid Other | Attending: Emergency Medicine | Admitting: Emergency Medicine

## 2022-07-10 ENCOUNTER — Emergency Department (HOSPITAL_BASED_OUTPATIENT_CLINIC_OR_DEPARTMENT_OTHER): Payer: Medicaid Other | Admitting: Radiology

## 2022-07-10 ENCOUNTER — Other Ambulatory Visit (HOSPITAL_BASED_OUTPATIENT_CLINIC_OR_DEPARTMENT_OTHER): Payer: Self-pay

## 2022-07-10 ENCOUNTER — Encounter (HOSPITAL_BASED_OUTPATIENT_CLINIC_OR_DEPARTMENT_OTHER): Payer: Self-pay

## 2022-07-10 DIAGNOSIS — S8391XA Sprain of unspecified site of right knee, initial encounter: Secondary | ICD-10-CM | POA: Insufficient documentation

## 2022-07-10 DIAGNOSIS — Y9289 Other specified places as the place of occurrence of the external cause: Secondary | ICD-10-CM | POA: Insufficient documentation

## 2022-07-10 DIAGNOSIS — Y9301 Activity, walking, marching and hiking: Secondary | ICD-10-CM | POA: Insufficient documentation

## 2022-07-10 DIAGNOSIS — W502XXA Accidental twist by another person, initial encounter: Secondary | ICD-10-CM | POA: Insufficient documentation

## 2022-07-10 MED ORDER — CYCLOBENZAPRINE HCL 10 MG PO TABS
10.0000 mg | ORAL_TABLET | Freq: Two times a day (BID) | ORAL | 0 refills | Status: AC | PRN
Start: 2022-07-10 — End: ?
  Filled 2022-07-10: qty 20, 10d supply, fill #0

## 2022-07-10 NOTE — ED Notes (Signed)
Discharge paperwork given and verbally understood. 

## 2022-07-10 NOTE — Discharge Instructions (Addendum)
Please continue to wear your knee sleeve for support.  Continue to alternate between Tylenol and ibuprofen as needed for pain.  Continue to use Biofreeze as you have been doing.  You Kauk take muscle relaxant at nighttime to help you sleep when the pain bothers you.  It is important for you to follow-up with your orthopedic specialist for further evaluation and management.  You Bransfield benefit from an MRI of your knee if your pain persist.

## 2022-07-10 NOTE — ED Triage Notes (Addendum)
Pt states that he tripped on some branches a couple of days before (9/27), injuring right knee. Pt states that he is having severe pain in the right knee, pt is wearing a brace from home. Pt c/o muscle spasms

## 2022-07-10 NOTE — ED Provider Notes (Signed)
[facility] EMERGENCY DEPT Provider Note   CSN: [id number] Arrival date & time: 07/10/22  1317     History  Chief Complaint  Patient presents with   Knee Pain    Heather Walter is a 36 y.o. adult.  The history is provided by the patient and medical records. No language interpreter was used.  Knee Pain    36 year old female to female patient significant history of bipolar disorder, prior knee arthroscopy, presenting for evaluation of knee injury.  Patient reports that 6 days ago Heather Walter was walking on uneven ground and twisted his right ankle but did not fall to the ground.  Heather Walter did notice some mild tenderness to his right knee at that time but since then Heather Walter reports knee pain has increased.  Pain is described as a sharp stabbing pain radiates towards his thigh and down to his calf worse with movement.  Heather Walter endorsed hearing pops in his knee.  Heather Walter tries wearing knee sleeve, alternate between Tylenol and ibuprofen, and using Biofreeze initially with some improvement but now the pain became more severe.  Heather Walter does not endorse any fever or chills Heather Walter denies any sensation of his knee locking and denies any ankle or hip pain.  Home Medications Prior to Admission medications   Medication Sig Start Date End Date Taking? Authorizing Provider  ARIPiprazole (ABILIFY PO) Take by mouth.    [provider]  diphenhydrAMINE (BENADRYL) 25 mg capsule Take 2 capsules (50 mg total) by mouth every 6 (six) hours as needed. 12/16/19   Sharman Cheek, MD  fluticasone [facility]) 50 MCG/ACT nasal spray Place 2 sprays into both nostrils daily. 10/19/20   Menshew, Charlesetta Ivory, PA-C  LamoTRIgine (LAMICTAL PO) Take by mouth.    [provider]  lidocaine (XYLOCAINE) 2 % solution Apply small amount on a cotton ball and place on tooth for dental pain 01/22/22   Tommi Rumps, PA-C  Lisdexamfetamine Dimesylate (VYVANSE PO) Take by mouth.    [provider]  LITHIUM PO Take by mouth.     [provider]  neomycin-polymyxin-pramoxine (NEOSPORIN PLUS) 1 % cream Apply topically 2 (two) times daily. 03/09/20   Joni Reining, PA-C  ondansetron (ZOFRAN-ODT) 4 MG disintegrating tablet Take 1 tablet (4 mg total) by mouth every 8 (eight) hours as needed for nausea or vomiting. 03/28/22   Irean Hong, MD  oxyCODONE-acetaminophen (PERCOCET/ROXICET) 5-325 MG tablet Take 1 tablet by mouth every 4 (four) hours as needed for severe pain. 03/28/22   Irean Hong, MD      Allergies    Fish allergy and Risperidone and related    Review of Systems   Review of Systems  All other systems reviewed and are negative.   Physical Exam Updated Vital Signs BP 138/80 (BP Location: Right Arm)   Pulse 92   Temp 98.4 F (36.9 C) (Oral)   Resp (!) 22   Ht 5\' 4"  (1.626 m)   Wt 63.5 kg   SpO2 98%   BMI 24.03 kg/m  Physical Exam Constitutional:      General: Heather Walter is not in acute distress.    Appearance: Heather Walter is well-developed.  HENT:     Head: Normocephalic and atraumatic.  Eyes:     Conjunctiva/sclera: Conjunctivae normal.  Cardiovascular:     Rate and Rhythm: Normal rate and regular rhythm.     Heart sounds: No murmur heard.    No friction rub. No gallop.  Pulmonary:  Effort: Pulmonary effort is normal. No respiratory distress.     Breath sounds: No wheezing.  Abdominal:     General: Bowel sounds are normal.     Palpations: Abdomen is soft.     Tenderness: There is no abdominal tenderness.  Musculoskeletal:        General: Tenderness (Right knee: Tenderness to medial and lateral joint line without any obvious deformity no swelling no erythema no warmth noted.  Patella is located.  Able to flex and extend knee without difficulty.  No joint laxity.) present. Normal range of motion.     Cervical back: Normal range of motion and neck supple.     Comments: Right hip and right ankle nontender.  DP pulse palpable.  Neurological:     Mental Status: Heather Walter is alert and oriented to  person, place, and time.     ED Results / Procedures / Treatments   Labs (all labs ordered are listed, but only abnormal results are displayed) Labs Reviewed - No data to display  EKG None  Radiology DG Knee Complete 4 Views Right  Result Date: 07/10/2022 CLINICAL DATA:  A 36 year old female presents following fall on 07/04/2022. RIGHT lateral knee pain following twisting of the knee since that time. EXAM: RIGHT KNEE - COMPLETE 4+ VIEW COMPARISON:  None available FINDINGS: No signs of joint effusion or fracture. No evidence of dislocation. Mild enthesopathy upon the patella and with very mild degenerative changes of the knee. IMPRESSION: No acute findings. Mild degenerative changes of the knee. Electronically Signed   By: Donzetta Kohut M.D.   On: 07/10/2022 14:02    Procedures Procedures    Medications Ordered in ED Medications - No data to display  ED Course/ Medical Decision Making/ A&P                           Medical Decision Making Amount and/or Complexity of Data Reviewed Radiology: ordered.   BP 138/80 (BP Location: Right Arm)   Pulse 92   Temp 98.4 F (36.9 C) (Oral)   Resp (!) 22   Ht 5\' 4"  (1.626 m)   Wt 63.5 kg   SpO2 98%   BMI 24.03 kg/m   3:32 PM This is a 36 year old female presenting complaining of right knee injury.  Patient states Heather Walter has had multiple injury involving his right knee requiring surgery in the past and states Heather Walter always has pain about his right knee.  6 days ago Heather Walter twisted his right ankle and experiencing pain to his right knee.  Since then the pain has increased in severity and now radiates towards his right thigh and right calf laterally.  Despite using knee sleeves, ibuprofen and Tylenol and Biofreeze his pain is not adequately controlled prompting this ER visit.  Heather Walter denies any numbness or weakness denies any fever or chills and no history of gout.  On exam, patient is resting comfortably appears to be in no acute discomfort.  Right knee  with tenderness to medial lateral joint line but no joint laxity.  Patella is located.  No overlying skin changes no erythema edema or warmth no signs of cellulitis signs of septic joint.  Heather Walter is able to flex and extend his knee without difficulty.  No tenderness to right hip or right ankle.  Dorsalis pedis pulses are palpable.  Patient able to ambulate.  I suspect his pain is likely due to a sprain I have low suspicion for cellulitis or septic  joint fracture or dislocation.  An x-ray of the right knee was obtained independently viewed interpreted by me and I agree with radiologist interpretation.  X-ray without any acute finding.  Patient is afebrile.  I have reviewed patient's prior EMR and considered in the plan of care.  I have reviewed patient social determinant of health which includes tobacco use and recommend tobacco cessation.  I will discharge patient home with muscle relaxant and also encouraged patient to follow-up with orthopedist for outpatient evaluation which [place] include an MRI if indicated.  Patient otherwise stable to be discharged.  Continue with RICE therapy.        Final Clinical Impression(s) / ED Diagnoses Final diagnoses:  Sprain of right knee, unspecified ligament, initial encounter    Rx / DC Orders ED Discharge Orders          Ordered    cyclobenzaprine (FLEXERIL) 10 MG tablet  2 times daily PRN        07/10/22 1527              Domenic Moras, PA-C 07/10/22 1534    Malvin Johns, MD 07/10/22 2242

## 2022-07-21 ENCOUNTER — Other Ambulatory Visit (HOSPITAL_BASED_OUTPATIENT_CLINIC_OR_DEPARTMENT_OTHER): Payer: Self-pay

## 2022-11-01 ENCOUNTER — Other Ambulatory Visit (HOSPITAL_BASED_OUTPATIENT_CLINIC_OR_DEPARTMENT_OTHER): Payer: Self-pay | Admitting: Primary Care

## 2022-11-01 DIAGNOSIS — R1011 Right upper quadrant pain: Secondary | ICD-10-CM
